# Patient Record
Sex: Female | Born: 1980 | Race: Black or African American | Hispanic: No
Health system: Western US, Academic
[De-identification: ages and names within clinical notes are randomized; demographics above are authoritative.]

## PROBLEM LIST (undated history)

## (undated) DIAGNOSIS — K219 Gastro-esophageal reflux disease without esophagitis: Secondary | ICD-10-CM

## (undated) DIAGNOSIS — R112 Nausea with vomiting, unspecified: Secondary | ICD-10-CM

## (undated) DIAGNOSIS — M1990 Unspecified osteoarthritis, unspecified site: Secondary | ICD-10-CM

## (undated) DIAGNOSIS — Z9889 Other specified postprocedural states: Secondary | ICD-10-CM

## (undated) DIAGNOSIS — R011 Cardiac murmur, unspecified: Secondary | ICD-10-CM

## (undated) DIAGNOSIS — S86819A Strain of other muscle(s) and tendon(s) at lower leg level, unspecified leg, initial encounter: Secondary | ICD-10-CM

## (undated) DIAGNOSIS — T7840XA Allergy, unspecified, initial encounter: Secondary | ICD-10-CM

## (undated) HISTORY — PX: PR UNLISTED PROCEDURE HANDS/FINGERS: 26989

## (undated) HISTORY — DX: Strain of other muscle(s) and tendon(s) at lower leg level, unspecified leg, initial encounter: S86.819A

## (undated) HISTORY — DX: Allergy, unspecified, initial encounter: T78.40XA

## (undated) DEATH — deceased

---

## 1999-03-25 HISTORY — PX: WISDOM TOOTH EXTRACTION: SHX21

## 2013-03-24 HISTORY — PX: GUM SURGERY: SHX658

## 2015-03-25 HISTORY — PX: PATELLAR TENDON REPAIR: SHX737

## 2016-01-07 DIAGNOSIS — S838X9A Sprain of other specified parts of unspecified knee, initial encounter: Secondary | ICD-10-CM | POA: Insufficient documentation

## 2016-01-07 DIAGNOSIS — S86819A Strain of other muscle(s) and tendon(s) at lower leg level, unspecified leg, initial encounter: Secondary | ICD-10-CM | POA: Insufficient documentation

## 2016-01-07 DIAGNOSIS — Q682 Congenital deformity of knee: Secondary | ICD-10-CM | POA: Insufficient documentation

## 2017-03-24 DIAGNOSIS — Z8614 Personal history of Methicillin resistant Staphylococcus aureus infection: Secondary | ICD-10-CM

## 2017-03-24 HISTORY — DX: Personal history of Methicillin resistant Staphylococcus aureus infection: Z86.14

## 2019-02-08 ENCOUNTER — Encounter (HOSPITAL_BASED_OUTPATIENT_CLINIC_OR_DEPARTMENT_OTHER): Payer: No Typology Code available for payment source | Admitting: Surgery of the Hand

## 2019-02-08 ENCOUNTER — Other Ambulatory Visit (HOSPITAL_BASED_OUTPATIENT_CLINIC_OR_DEPARTMENT_OTHER): Payer: Self-pay | Admitting: Physician Assistant

## 2019-02-08 DIAGNOSIS — R2231 Localized swelling, mass and lump, right upper limb: Secondary | ICD-10-CM

## 2019-02-14 ENCOUNTER — Ambulatory Visit (HOSPITAL_BASED_OUTPATIENT_CLINIC_OR_DEPARTMENT_OTHER): Payer: No Typology Code available for payment source | Attending: Physician Assistant | Admitting: Orthopaedic Surgery

## 2019-02-14 ENCOUNTER — Encounter (HOSPITAL_BASED_OUTPATIENT_CLINIC_OR_DEPARTMENT_OTHER): Payer: Self-pay | Admitting: Orthopaedic Surgery

## 2019-02-14 ENCOUNTER — Ambulatory Visit (HOSPITAL_BASED_OUTPATIENT_CLINIC_OR_DEPARTMENT_OTHER): Payer: No Typology Code available for payment source | Admitting: Diagnostic Radiology

## 2019-02-14 VITALS — BP 126/72 | HR 71 | Ht 68.0 in | Wt 230.0 lb

## 2019-02-14 DIAGNOSIS — M6740 Ganglion, unspecified site: Secondary | ICD-10-CM | POA: Insufficient documentation

## 2019-02-14 DIAGNOSIS — R2231 Localized swelling, mass and lump, right upper limb: Secondary | ICD-10-CM

## 2019-02-14 DIAGNOSIS — M25841 Other specified joint disorders, right hand: Secondary | ICD-10-CM

## 2019-02-14 HISTORY — DX: Other specified joint disorders, right hand: M25.841

## 2019-02-14 NOTE — Progress Notes (Signed)
Chief Complaint   Patient presents with   . New Patient     cyst on right RF       Loretta Allen is a 38 year old RHD female who works in [employer] for [employer], she previously worked as a Industrial/product designer for [employer] and currently owns a horse which she rides in show competitions.  She presents with a chief complaint of swelling in the right ring finger that has been ongoing for 6 months.  Patient denies any history of trauma or injury to the hand. The swelling initially began at her MCPJ on the plamar aspect and felt like a small callous. However, it has gradually increased in size and has migrated to the ulnar side of the digit. This is usually not painful, however when she has to grip object like a car steering wheel or horse reins it gets in the way and is tender. She denies numbness or tingling in the digit.    Past Medical History:  Past Medical History:   Diagnosis Date   . Cyst of joint of hand, right 02/14/2019    ring finger   . History of MRSA infection 2019       Medications:  No current outpatient medications on file.     No current facility-administered medications for this visit.        Allergies:   Review of patient's allergies indicates:  No Known Allergies    Past Surgical History:  Past Surgical History:   Procedure Laterality Date   . PATELLA SURGERY      tendon repair       Social History:   The patient states her problem is not work related.   She rides her horse 4-5 days per week and is active in show competitions.    Social History     Substance and Sexual Activity   Alcohol Use Yes   . Frequency: 2-3 times a week     Social History     Tobacco Use   Smoking Status Never Smoker   Smokeless Tobacco Never Used       ROS:   Positive for MSK findings as noted above in HPI. All other systems of the 14 reviewed are negative.     Hand & Upper Extremity Examination    Physical Examination  Gen: Patient is healthy, alert, no distress    Psych: Alert and oriented times  3  Pleasant female. Mood and affect appropriate.    Skin: warm, normal color and sweat patterns.    Vascular: Fingers warm, pink, with brisk capillary refill    Neurologic: Sensation in R hand intact in median, radial, and ulnar nerve distributions. There is no numbness or tingling of the digits with palpation of the RF mass.    Musculoskeletal:  Inspection of the  right upper extremity shows no gross deformity or evidence of atrophy.  2 mm round, mobile, fluid-filled cyst over the ulnar border of the R RF MCPJ. There are no overlying skin changes. The mass is slightly tender to palpation.  Full composite flexion/extension all digits  5/5 strength APB, intrinsics    Studies:  X-rays of the R RF were reviewed and show no acute fractures or dislocations, there are no osteophytes or osteoarthritis present.    Assessment:   Loretta Allen is a 38 year old RHD female who presents with a R RF retinacular cyst, increasing in size for the past several months.  Plan:  1. Patient will undergo excision of R RF cyst at her convenience, she would like to delay surgery until after 02/26/19 as she has a horse competition show that day. She was consented for surgery in clinic today. We discussed the risks, benefits, and alternatives to surgery. Risks include a infection and damage to some of the digital nerves and vessels of the RF. She expressed her understanding and elected to proceed with surgery. She will meet with the surgery scheduler today.  2. Preop COVID test  3. Follow-up after procedure

## 2019-02-14 NOTE — Progress Notes (Signed)
I saw and evaluated the patient and agree with Dr. Delbert Harness note.

## 2019-02-28 ENCOUNTER — Ambulatory Visit (HOSPITAL_BASED_OUTPATIENT_CLINIC_OR_DEPARTMENT_OTHER): Payer: No Typology Code available for payment source | Attending: Internal Medicine

## 2019-02-28 ENCOUNTER — Ambulatory Visit (HOSPITAL_BASED_OUTPATIENT_CLINIC_OR_DEPARTMENT_OTHER): Payer: No Typology Code available for payment source

## 2019-02-28 DIAGNOSIS — Z01812 Encounter for preprocedural laboratory examination: Secondary | ICD-10-CM

## 2019-02-28 DIAGNOSIS — Z20828 Contact with and (suspected) exposure to other viral communicable diseases: Secondary | ICD-10-CM

## 2019-02-28 LAB — COVID-19 CORONAVIRUS QUALITATIVE PCR: COVID-19 Coronavirus Qual PCR Result: NOT DETECTED

## 2019-02-28 NOTE — Progress Notes (Signed)
Patient was seen on 02/28/2019 at the [facility] drive up site where a sample of dual nasal pharyngeal collection was taken. The specimen was sent to the [facility] lab for COVID-19 testing.  Patient will be informed of test results within 48 hours.  Patient received informational instructions on self-care.    The specimen was collected by: J.O.

## 2019-03-02 ENCOUNTER — Other Ambulatory Visit (HOSPITAL_COMMUNITY): Payer: Self-pay | Admitting: Student in an Organized Health Care Education/Training Program

## 2019-03-02 ENCOUNTER — Ambulatory Visit (HOSPITAL_BASED_OUTPATIENT_CLINIC_OR_DEPARTMENT_OTHER): Payer: No Typology Code available for payment source | Admitting: Orthopaedic Surgery

## 2019-03-02 ENCOUNTER — Ambulatory Visit (HOSPITAL_BASED_OUTPATIENT_CLINIC_OR_DEPARTMENT_OTHER)
Admission: RE | Admit: 2019-03-02 | Discharge: 2019-03-02 | Disposition: A | Discharge status: Home / Self Care | Payer: No Typology Code available for payment source | Attending: Orthopaedic Surgery | Admitting: Orthopaedic Surgery

## 2019-03-02 DIAGNOSIS — E669 Obesity, unspecified: Secondary | ICD-10-CM | POA: Insufficient documentation

## 2019-03-02 DIAGNOSIS — M25841 Other specified joint disorders, right hand: Secondary | ICD-10-CM | POA: Insufficient documentation

## 2019-03-02 DIAGNOSIS — R2231 Localized swelling, mass and lump, right upper limb: Secondary | ICD-10-CM

## 2019-03-02 DIAGNOSIS — K219 Gastro-esophageal reflux disease without esophagitis: Secondary | ICD-10-CM | POA: Insufficient documentation

## 2019-03-02 DIAGNOSIS — Z6834 Body mass index (BMI) 34.0-34.9, adult: Secondary | ICD-10-CM | POA: Insufficient documentation

## 2019-03-02 MED ORDER — OXYCODONE HCL 5 MG OR TABS
ORAL_TABLET | ORAL | 0 refills | Status: AC
Start: 2019-03-02 — End: 2019-08-29

## 2019-03-02 MED ORDER — OMEPRAZOLE 20 MG OR CPDR
DELAYED_RELEASE_CAPSULE | ORAL | 0 refills | Status: AC
Start: 2019-03-02 — End: 2020-03-01

## 2019-03-02 MED ORDER — ONDANSETRON HCL 4 MG OR TABS
ORAL_TABLET | ORAL | 0 refills | Status: AC
Start: 2019-03-02 — End: 2020-03-01

## 2019-03-02 MED ORDER — SENNOSIDES 8.6 MG OR TABS
ORAL_TABLET | ORAL | 0 refills | Status: AC
Start: 2019-03-02 — End: 2020-03-01

## 2019-03-02 MED ORDER — IBUPROFEN 600 MG OR TABS
ORAL_TABLET | ORAL | 0 refills | Status: AC
Start: 2019-03-02 — End: 2020-03-01

## 2019-03-02 MED ORDER — ACETAMINOPHEN 500 MG OR TABS
ORAL_TABLET | ORAL | 3 refills | Status: AC
Start: 2019-03-02 — End: 2020-03-01

## 2019-03-14 ENCOUNTER — Encounter (HOSPITAL_BASED_OUTPATIENT_CLINIC_OR_DEPARTMENT_OTHER): Payer: Self-pay | Admitting: Physician Assistant

## 2019-03-14 ENCOUNTER — Ambulatory Visit (HOSPITAL_BASED_OUTPATIENT_CLINIC_OR_DEPARTMENT_OTHER): Payer: No Typology Code available for payment source | Attending: Physician Assistant | Admitting: Physician Assistant

## 2019-03-14 VITALS — BP 120/64 | HR 77 | Resp 16 | Ht 68.0 in | Wt 230.0 lb

## 2019-03-14 DIAGNOSIS — L720 Epidermal cyst: Secondary | ICD-10-CM | POA: Insufficient documentation

## 2019-03-14 NOTE — Progress Notes (Signed)
Clinic Followup Note  DOS: 03/02/2019 by Dr. Danae Chen  Procedure:   Excision of right ring finger retinacular cyst.     HPI: Loretta Allen is a 38 year old female 12 days status post retinacular cyst excision of the right ring finger.    Interval History: She reports doing well following surgery with minimal pain and only some residual soreness at this point.  She has questions about protecting the finger for equestrian activities.    Past medical history, surgical history, medications and allergies were reviewed in EPIC and are unchanged.    Review of Systems: Pertinent positives per interval history; no further neurologic, musculoskeletal or integument complaints.    Physical Exam:   Blood pressure 120/64, pulse 77, resp. rate 16, height 5\' 8"  (1.727 m), weight (!) 230 lb (104.3 kg).  Gen: NAD   Neuro: AOx3   Ext: Right ring finger:  Incision is somewhat gapped today without complete dehiscence.  The base of the incision appears well adhered.  Nylon sutures are removed and Steri-Strips are applied.    Imaging:  None    Assessment and Plan:   Loretta Allen is a 38 year old female with healing retinacular excision of the right ring finger, 12 days ago by Dr. Danae Chen.  She is doing well today clinically.  There are some slight gapping of the incision however no suggestion of full dehiscence or infection.  Sutures are removed and Steri-Strips are applied.  She may progress activities in a gradual fashion as tolerated.  I explained she would likely have slightly thicker scar tissue as a result is counseled on antiscar-measures/massage.  There was no pathology result available to review with her.  She may follow-up in clinic on an as-needed basis. Patient voiced agreement and understanding with the treatment plan.     Loretta Allen, MPH, MCHS, PA-C  [facility]  Department of Orthopedics

## 2019-03-25 HISTORY — PX: FINGER MASS EXCISION: SHX1638

## 2020-12-31 ENCOUNTER — Other Ambulatory Visit: Payer: Self-pay

## 2020-12-31 ENCOUNTER — Encounter: Payer: Self-pay | Admitting: Family Medicine

## 2020-12-31 ENCOUNTER — Ambulatory Visit (INDEPENDENT_AMBULATORY_CARE_PROVIDER_SITE_OTHER): Payer: BC Managed Care – PPO | Admitting: Family Medicine

## 2020-12-31 VITALS — BP 98/64 | HR 80 | Temp 98.1°F | Ht 67.0 in | Wt 249.0 lb

## 2020-12-31 DIAGNOSIS — E282 Polycystic ovarian syndrome: Secondary | ICD-10-CM | POA: Diagnosis not present

## 2020-12-31 DIAGNOSIS — J0100 Acute maxillary sinusitis, unspecified: Secondary | ICD-10-CM | POA: Diagnosis not present

## 2020-12-31 DIAGNOSIS — M222X2 Patellofemoral disorders, left knee: Secondary | ICD-10-CM | POA: Diagnosis not present

## 2020-12-31 DIAGNOSIS — T63481S Toxic effect of venom of other arthropod, accidental (unintentional), sequela: Secondary | ICD-10-CM | POA: Diagnosis not present

## 2020-12-31 MED ORDER — DICLOFENAC SODIUM 2 % EX SOLN
2.0000 | Freq: Two times a day (BID) | CUTANEOUS | 0 refills | Status: DC
Start: 2020-12-31 — End: 2021-01-31

## 2020-12-31 NOTE — Progress Notes (Signed)
Primary Care / Sports Medicine Office Visit  Patient Information:  Patient ID: Sarah Lowe, female DOB: 12-13-1980 Age: 40 y.o. MRN: [id number]   Sarah Lowe is a pleasant 40 y.o. female presenting with the following:  Chief Complaint  Patient presents with   New Patient (Initial Visit)   Establish Care    Moved from [place], [place] about a year ago   Allergic Rhinitis     Intermittent headaches and congestion; has tried taking Claritin and Zyrtec with no relief of symptoms, so not taking anything now; has Epi pen on hand in case she needs it for anaphylactic reaction she had from a bug bite 09/2020    Obesity    Concern for weight gain; history of taking phentermine 37.5 mg daily and is getting current B12, HCG, and lipotropic injections weekly at [facility]; history of rupturing bilateral patellar tendons 2017; left knee pain in office today; swelling associated; no exercise; 7/10 pain    Review of Systems pertinent details above   Patient Active Problem List   Diagnosis Date Noted   Patellofemoral syndrome, left 12/31/2020   Insect sting allergy, current reaction, accidental or unintentional, sequela 12/31/2020   Acute non-recurrent maxillary sinusitis 12/31/2020   PCOS (polycystic ovarian syndrome) 12/31/2020   Past Medical History:  Diagnosis Date   Allergy    Outpatient Encounter Medications as of 12/31/2020  Medication Sig   Diclofenac Sodium 2 % SOLN Place 2 sprays onto the skin 2 (two) times daily.   EPINEPHrine 0.3 mg/0.3 mL IJ SOAJ injection Inject 0.3 mg into the muscle once as needed.   phentermine (ADIPEX-P) 37.5 MG tablet Take 37.5 mg by mouth daily.   No facility-administered encounter medications on file as of 12/31/2020.   Past Surgical History:  Procedure Laterality Date   FINGER MASS EXCISION Right 2021   fourth finger   GUM SURGERY  2015   PATELLAR TENDON REPAIR Bilateral 2017   WISDOM TOOTH EXTRACTION Bilateral 2001     Vitals:   12/31/20 1110  BP: 98/64  Pulse: 80  Temp: 98.1 F (36.7 C)  SpO2: 99%   Vitals:   12/31/20 1110  Weight: 249 lb (112.9 kg)  Height: 5\' 7"  (1.702 m)   Body mass index is 39 kg/m.  No results found.   Independent interpretation of notes and tests performed by another provider:   None  Procedures performed:   None  Pertinent History, Exam, Impression, and Recommendations:   PCOS (polycystic ovarian syndrome) Stated history of the same, referral to OB/GYN was placed.  She has been on metformin in the past this was discontinued citing efficacy issues.  I discussed the nature of PCOS and associated treatments.  She is amenable to following up with OB/GYN, she will return for follow-up in our office for annual physical (no gynecologic), risk stratification labs to be obtained at that time.  Patellofemoral syndrome, left Chronic issue with ongoing exacerbation, she has had what she describes as a tibial tubercle osteotomy for this with subsequent revision.  She remains symptomatic, pain focal to the superior/medial aspect of the patella, nonradiating, she has secondary lateral hip pain.  Examination reveals focal VMO insertional tenderness, otherwise benign.  Plan for plain films, formal physical therapy, Rx samples Pennsaid twice daily until follow-up in 3 weeks.  I did discuss additional treatment strategies in the future inclusive of possible viscosupplementation, orthotics, etc.  Acute non-recurrent maxillary sinusitis Currently on Augmentin through outside clinic, ENT exam reveals  erythematous and swollen turbinates, left greater than right, right maxillary tenderness, tympanic membranes and oropharynx otherwise benign.  Cardiopulmonary examination benign today.  I have advised adjunct Flonase until antibiotics complete, she can then transition to seasonal usage.  Insect sting allergy, current reaction, accidental or unintentional, sequela New onset systemic  response from insect bite, does have Rx for EpiPen from urgent care.  She denies any additional occurrences and does cite total resolution from previous episode.  I have placed a referral to allergist for further evaluation of this issue.   Orders & Medications Meds ordered this encounter  Medications   Diclofenac Sodium 2 % SOLN    Sig: Place 2 sprays onto the skin 2 (two) times daily.    Dispense:  6 g    Refill:  0    Use Programme researcher, broadcasting/film/video for discount. Dx. Osteoarthritis   Orders Placed This Encounter  Procedures   DG Knee Complete 4 Views Left   Ambulatory referral to Allergy   Ambulatory referral to Gynecology   Ambulatory referral to Physical Therapy     Return in about 3 weeks (around 01/21/2021) for annual physical.     Jerrol Banana, MD   Primary Care Sports Medicine [facility]

## 2020-12-31 NOTE — Assessment & Plan Note (Addendum)
Currently on Augmentin through outside clinic, ENT exam reveals erythematous and swollen turbinates, left greater than right, right maxillary tenderness, tympanic membranes and oropharynx otherwise benign.  Cardiopulmonary examination benign today.  I have advised adjunct Flonase until antibiotics complete, she can then transition to seasonal usage.

## 2020-12-31 NOTE — Patient Instructions (Addendum)
-   Obtain x-rays - Start Flonase, using each nostril daily until antibiotic regimen completed - Can then transition to seasonal usage - Start topical anti-inflammatory Pennsaid (diclofenac 2%), apply scant amount twice daily to symptomatic area of the knee until follow-up - Referral coordinator will contact you in regards to physical therapy, allergist, OB/GYN - Return for follow-up in 3 weeks for annual physical - Contact us for questions between now and then

## 2020-12-31 NOTE — Assessment & Plan Note (Signed)
New onset systemic response from insect bite, does have Rx for EpiPen from urgent care.  She denies any additional occurrences and does cite total resolution from previous episode.  I have placed a referral to allergist for further evaluation of this issue.

## 2020-12-31 NOTE — Assessment & Plan Note (Signed)
Chronic issue with ongoing exacerbation, she has had what she describes as a tibial tubercle osteotomy for this with subsequent revision.  She remains symptomatic, pain focal to the superior/medial aspect of the patella, nonradiating, she has secondary lateral hip pain.  Examination reveals focal VMO insertional tenderness, otherwise benign.  Plan for plain films, formal physical therapy, Rx samples Pennsaid twice daily until follow-up in 3 weeks.  I did discuss additional treatment strategies in the future inclusive of possible viscosupplementation, orthotics, etc.

## 2020-12-31 NOTE — Assessment & Plan Note (Signed)
Stated history of the same, referral to OB/GYN was placed.  She has been on metformin in the past this was discontinued citing efficacy issues.  I discussed the nature of PCOS and associated treatments.  She is amenable to following up with OB/GYN, she will return for follow-up in our office for annual physical (no gynecologic), risk stratification labs to be obtained at that time.

## 2021-01-15 ENCOUNTER — Telehealth: Payer: Self-pay

## 2021-01-15 NOTE — Telephone Encounter (Signed)
Copied from CRM [id number]. Topic: General - Other >> Jan 15, 2021  2:15 PM Gaetana Michaelis A wrote: Reason for CRM: The patient has called to request that referral [phone] be submitted to    [facility] in [place]  Fax [phone]   Please contact further if needed >> Jan 15, 2021  3:11 PM Randol Kern wrote: Pt called back to request that her referral be submitted to the location/fax listed below and not the one in [place] because that is too far of a drive for her she says.

## 2021-01-15 NOTE — Telephone Encounter (Signed)
Copied from CRM [id number]. Topic: General - Other >> Jan 15, 2021  2:15 PM Gaetana Michaelis A wrote: Reason for CRM: The patient has called to request that referral [phone] be submitted to    [facility] in [place]  Fax [phone]   Please contact further if needed

## 2021-01-16 ENCOUNTER — Encounter: Payer: Self-pay | Admitting: Obstetrics and Gynecology

## 2021-01-16 ENCOUNTER — Ambulatory Visit: Payer: BC Managed Care – PPO | Admitting: Obstetrics and Gynecology

## 2021-01-16 ENCOUNTER — Ambulatory Visit
Admission: RE | Admit: 2021-01-16 | Discharge: 2021-01-16 | Disposition: A | Discharge status: Home / Self Care | Payer: BC Managed Care – PPO | Attending: Family Medicine | Admitting: Family Medicine

## 2021-01-16 ENCOUNTER — Other Ambulatory Visit (HOSPITAL_COMMUNITY)
Admission: RE | Admit: 2021-01-16 | Discharge: 2021-01-16 | Disposition: A | Discharge status: Home / Self Care | Payer: BC Managed Care – PPO | Source: Ambulatory Visit | Attending: Obstetrics and Gynecology | Admitting: Obstetrics and Gynecology

## 2021-01-16 ENCOUNTER — Other Ambulatory Visit: Payer: Self-pay

## 2021-01-16 ENCOUNTER — Ambulatory Visit
Admission: RE | Admit: 2021-01-16 | Discharge: 2021-01-16 | Disposition: A | Discharge status: Home / Self Care | Payer: BC Managed Care – PPO | Source: Ambulatory Visit | Attending: Family Medicine | Admitting: Family Medicine

## 2021-01-16 VITALS — BP 130/81 | Ht 68.0 in | Wt 248.0 lb

## 2021-01-16 DIAGNOSIS — M222X2 Patellofemoral disorders, left knee: Secondary | ICD-10-CM

## 2021-01-16 DIAGNOSIS — E282 Polycystic ovarian syndrome: Secondary | ICD-10-CM

## 2021-01-16 DIAGNOSIS — Z3169 Encounter for other general counseling and advice on procreation: Secondary | ICD-10-CM | POA: Diagnosis not present

## 2021-01-16 DIAGNOSIS — Z124 Encounter for screening for malignant neoplasm of cervix: Secondary | ICD-10-CM

## 2021-01-18 LAB — CYTOLOGY - PAP
Adequacy: ABSENT
Comment: NEGATIVE
Diagnosis: NEGATIVE
High risk HPV: NEGATIVE

## 2021-01-18 NOTE — Telephone Encounter (Signed)
Message sent to Retta Diones to update referral location as requested.

## 2021-01-21 ENCOUNTER — Encounter: Payer: BC Managed Care – PPO | Admitting: Family Medicine

## 2021-01-22 LAB — ANTI MULLERIAN HORMONE: ANTI-MULLERIAN HORMONE (AMH): 3.51 ng/mL

## 2021-01-22 NOTE — Progress Notes (Signed)
Obstetrics & Gynecology Office Visit   Chief Complaint:  Chief Complaint  Patient presents with   PCOS    Referral  - RM 3    History of Present Illness: Patient is a 40 y.o. 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 She carries a prior diagnosis of PCOS although cycles have been relatively regular recently.  She had one prior conception with EAB  Ovulatory Evaluation LMP: Patient's last menstrual period was 12/19/2020. Interval 30days Duration of flow: 5 days Heavy Menses: no Clots: no Intermenstrual Bleeding: no Dysmenorrhea: no  Molimina yes Ovulation Predictor Kits Positive  unknown  PCOS  Did do body building for several year and lost a significant amount of weight an body fat at which time she actually experience amenorrhea. Hirsutism: no Acne: no  Hyperprolactinemia Galactorrhea: no Headaches: no Vision Changes:  no  Thyroid Temperature Intolerance: no Constipation or Diarrhea: no Hair Thinning:  no Palpitation:  no  Prior treatments Meds: none Other Therapies: Not applicable  Premature Ovarian Failure Family history of autism, mental retardation, or fragile X: no Family history of premature ovarian failure or early menopause: no Prior radiation or chemotherapy exposoure:  no  Tubal Factor Previous abdominal or pelvic surgery: no Pelvic Pain:  no Endometriosis: no Laparoscopy: no Prior HSG: no  Review of Systems: Review of Systems  Constitutional: Negative.   Gastrointestinal: Negative.   Genitourinary: Negative.   Neurological:  Negative for headaches.    Past Medical History:  Past Medical History:  Diagnosis Date   Allergy     Past Surgical History:  Past Surgical History:  Procedure Laterality Date   FINGER MASS EXCISION Right 2021   fourth finger   GUM SURGERY  2015   PATELLAR TENDON REPAIR Bilateral 2017   WISDOM TOOTH EXTRACTION Bilateral 2001    Gynecologic History: Patient's last menstrual period was  12/19/2020.  Obstetric History: G1P0010  Family History:  Family History  Problem Relation Age of Onset   Cancer Mother    Anemia Mother    Other Father        Agent Orange    Social History:  Social History   Socioeconomic History   Marital status: Married    Spouse name: Not on file   Number of children: Not on file   Years of education: Not on file   Highest education level: Not on file  Occupational History   Not on file  Tobacco Use   Smoking status: Never   Smokeless tobacco: Never  Vaping Use   Vaping Use: Never used  Substance and Sexual Activity   Alcohol use: Yes    Alcohol/week: 3.0 standard drinks    Types: 3 Standard drinks or equivalent per week   Drug use: Never   Sexual activity: Yes    Partners: Male    Birth control/protection: None  Other Topics Concern   Not on file  Social History Narrative   Not on file   Social Determinants of Health   Financial Resource Strain: Not on file  Food Insecurity: Not on file  Transportation Needs: Not on file  Physical Activity: Not on file  Stress: Not on file  Social Connections: Not on file  Intimate Partner Violence: Not on file    Allergies:  No Known Allergies  Medications: Prior to Admission medications   Medication Sig Start Date End Date Taking? Authorizing Provider  Diclofenac Sodium 2 % SOLN Place 2 sprays onto the skin 2 (two) times daily. 12/31/20  Jerrol Banana, MD  EPINEPHrine 0.3 mg/0.3 mL IJ SOAJ injection Inject 0.3 mg into the muscle once as needed. 09/30/20   [provider]  phentermine (ADIPEX-P) 37.5 MG tablet Take 37.5 mg by mouth daily. Patient not taking: Reported on 01/16/2021 10/13/20   [provider]    Physical Exam Vitals:  Vitals:   01/16/21 0941  BP: 130/81   Patient's last menstrual period was 12/19/2020.  General: NAD HEENT: normocephalic, anicteric Pulmonary: No increased work of breathing Abdomen: soft, non-tender, non-distended.   Umbilicus without lesions.  No hepatomegaly, splenomegaly or masses palpable. No evidence of hernia  Genitourinary:  External: Normal external female genitalia.  Normal urethral meatus, normal  Bartholin's and Skene's glands.    Vagina: Normal vaginal mucosa, no evidence of prolapse.    Cervix: Grossly normal in appearance, no bleeding  Uterus: Non-enlarged, mobile, normal contour.  No CMT  Adnexa: ovaries non-enlarged, no adnexal masses  Rectal: deferred  Lymphatic: no evidence of inguinal lymphadenopathy Extremities: no edema, erythema, or tenderness Neurologic: Grossly intact Psychiatric: mood appropriate, affect full  Female chaperone present for pelvic  portions of the physical exam  Assessment: 40 y.o. G1P001 history PCOS, preconception counseling  Plan: Problem List Items Addressed This Visit       Endocrine   PCOS (polycystic ovarian syndrome)   Relevant Orders   Anti mullerian hormone   Other Visit Diagnoses     Screening for malignant neoplasm of cervix    -  Primary   Relevant Orders   Cytology - PAP (Completed)   Encounter for preconception consultation       Relevant Orders   Anti mullerian hormone      1) History of PCOS - menstrual cycles have been more regular potentially interested in future fertility.  We discussed that PCOS may resolve over time particularly with weight loss. The patient does report she had a time frame where she worked out very diligently and got into body building, actually experienced amenorrhea likely to low body fat percentage.   Based on available IVF data there appears to be a large drop in overall fertility after age 63, and there is also an associated increase rate in miscarriage likely owing to the increased risk of trisomy with age.  The percentage of IVF cycle starts that resulted in live births was 41.5% in women younger than 35 years, 31.9% in women aged 35-37 years, 22.1% in women aged 38-40 years, 12.4% in women aged 41-42  years, 5% in women aged 43-44 years, and 1 % for women older than 44 years.  The rates of fetal loss and aneuploidy also increase based on maternal age.  Although 9.9% of women younger than 33 years who conceive during IVF with a fresh embryo transfer have a pregnancy loss after 7 weeks of gestation with fetal heart activity observed, the rates of miscarriage progressively increase from 11.4% for women aged 41-34 years to 13.7% for women aged 35-37 years, 19.8% for women aged 38-40 years, 29.9% for women aged 20-42 years, and 36.6% for women older than 40 years ("Female Age- Related Fertility Decline" ACOG Committee Opinion 589, March 2014 Reaffirmed 2018)  2) Health care maintenance - pap brought up to date.  3) A total of 20 minutes were spent in face-to-face contact with the patient during this encounter with over half of that time devoted to counseling and coordination of care.   Vena Austria, MD, [facility], [facility] 01/22/2021, 10:55 AM

## 2021-01-31 ENCOUNTER — Ambulatory Visit (INDEPENDENT_AMBULATORY_CARE_PROVIDER_SITE_OTHER): Payer: BC Managed Care – PPO | Admitting: Family Medicine

## 2021-01-31 ENCOUNTER — Encounter: Payer: Self-pay | Admitting: Family Medicine

## 2021-01-31 ENCOUNTER — Other Ambulatory Visit: Payer: Self-pay

## 2021-01-31 ENCOUNTER — Ambulatory Visit
Admission: RE | Admit: 2021-01-31 | Discharge: 2021-01-31 | Disposition: A | Discharge status: Home / Self Care | Payer: BC Managed Care – PPO | Attending: Family Medicine | Admitting: Family Medicine

## 2021-01-31 ENCOUNTER — Ambulatory Visit
Admission: RE | Admit: 2021-01-31 | Discharge: 2021-01-31 | Disposition: A | Discharge status: Home / Self Care | Payer: BC Managed Care – PPO | Source: Ambulatory Visit | Attending: Family Medicine | Admitting: Family Medicine

## 2021-01-31 VITALS — BP 106/64 | HR 70 | Temp 98.0°F | Ht 68.0 in | Wt 250.0 lb

## 2021-01-31 DIAGNOSIS — R29818 Other symptoms and signs involving the nervous system: Secondary | ICD-10-CM

## 2021-01-31 DIAGNOSIS — E559 Vitamin D deficiency, unspecified: Secondary | ICD-10-CM

## 2021-01-31 DIAGNOSIS — Z114 Encounter for screening for human immunodeficiency virus [HIV]: Secondary | ICD-10-CM

## 2021-01-31 DIAGNOSIS — R112 Nausea with vomiting, unspecified: Secondary | ICD-10-CM | POA: Insufficient documentation

## 2021-01-31 DIAGNOSIS — Z1159 Encounter for screening for other viral diseases: Secondary | ICD-10-CM | POA: Diagnosis not present

## 2021-01-31 DIAGNOSIS — Q682 Congenital deformity of knee: Secondary | ICD-10-CM

## 2021-01-31 DIAGNOSIS — M545 Low back pain, unspecified: Secondary | ICD-10-CM | POA: Diagnosis not present

## 2021-01-31 DIAGNOSIS — R292 Abnormal reflex: Secondary | ICD-10-CM | POA: Diagnosis not present

## 2021-01-31 DIAGNOSIS — R1112 Projectile vomiting: Secondary | ICD-10-CM

## 2021-01-31 DIAGNOSIS — Z1211 Encounter for screening for malignant neoplasm of colon: Secondary | ICD-10-CM

## 2021-01-31 DIAGNOSIS — Z13220 Encounter for screening for lipoid disorders: Secondary | ICD-10-CM

## 2021-01-31 DIAGNOSIS — Z0000 Encounter for general adult medical examination without abnormal findings: Secondary | ICD-10-CM | POA: Diagnosis not present

## 2021-01-31 DIAGNOSIS — M222X2 Patellofemoral disorders, left knee: Secondary | ICD-10-CM

## 2021-01-31 DIAGNOSIS — E249 Cushing's syndrome, unspecified: Secondary | ICD-10-CM

## 2021-01-31 MED ORDER — PANTOPRAZOLE SODIUM 40 MG PO TBEC: 40.0000 mg | DELAYED_RELEASE_TABLET | Freq: Every day | ORAL | 1 refills | Status: DC

## 2021-01-31 MED ORDER — DICLOFENAC SODIUM 2 % EX SOLN: 2.0000 | Freq: Two times a day (BID) | CUTANEOUS | 1 refills | Status: DC | PRN

## 2021-01-31 MED ORDER — PANTOPRAZOLE SODIUM 40 MG PO TBEC
40.0000 mg | DELAYED_RELEASE_TABLET | Freq: Every day | ORAL | 1 refills | Status: DC
Start: 2021-01-31 — End: 2021-03-28

## 2021-01-31 NOTE — Assessment & Plan Note (Signed)
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  She has been obtaining dry needling through chiropractic group, does have upcoming PT appointment scheduled.  I have advised continued topical NSAID usage, start of formal physical therapy with focus on quadriceps and quadriceps tendon flexibility and conditioning, stabilization of the patella.  We will follow-up on this issue in 2 months time.

## 2021-01-31 NOTE — Progress Notes (Signed)
Annual Physical Exam Visit  Patient Information:  Patient ID: Sarah Lowe, female DOB: December 11, 1980 Age: 40 y.o. MRN: [id number]   Subjective:   CC: Annual Physical Exam  HPI:  Sarah Lowe is here for their annual physical.  I reviewed the past medical history, family history, social history, surgical history, and allergies today and changes were made as necessary.  Please see the problem list section below for additional details.  Past Medical History: Past Medical History:  Diagnosis Date   Allergy    Past Surgical History: Past Surgical History:  Procedure Laterality Date   FINGER MASS EXCISION Right 2021   fourth finger   GUM SURGERY  2015   PATELLAR TENDON REPAIR Bilateral 2017   WISDOM TOOTH EXTRACTION Bilateral 2001   Family History: Family History  Problem Relation Age of Onset   Cancer Mother    Anemia Mother    Other Father        Agent Orange   Colon cancer Maternal Grandmother    Anuerysm Maternal Grandfather    Stroke Paternal Grandmother    Anuerysm Paternal Grandfather    Allergies: No Known Allergies Health Maintenance: Health Maintenance  Topic Date Due   Hepatitis C Screening  Never done   COVID-19 Vaccine (4 - Booster for Pfizer series) 05/25/2020   INFLUENZA VACCINE  06/21/2021 (Originally 10/22/2020)   TETANUS/TDAP  01/16/2022 (Originally 03/17/1999)   PAP SMEAR-Modifier  01/17/2024   HIV Screening  Completed   Pneumococcal Vaccine 48-35 Years old  Aged Out   HPV VACCINES  Aged Out    HM Colonoscopy     This patient has no relevant Health Maintenance data.      Medications: Current Outpatient Medications on File Prior to Visit  Medication Sig Dispense Refill   amoxicillin (AMOXIL) 500 MG capsule Take 500 mg by mouth 3 (three) times daily.     EPINEPHrine 0.3 mg/0.3 mL IJ SOAJ injection Inject 0.3 mg into the muscle once as needed.     phentermine (ADIPEX-P) 37.5 MG tablet Take 37.5 mg by mouth daily. (Patient not  taking: No sig reported)     No current facility-administered medications on file prior to visit.    Review of Systems: No headache, visual changes, nausea, vomiting, diarrhea, constipation, dizziness, abdominal pain, skin rash, fevers, chills, night sweats, swollen lymph nodes, weight loss, chest pain, body aches, joint swelling, muscle aches, shortness of breath, mood changes, visual or auditory hallucinations reported.  Objective:   Vitals:   01/31/21 1009  BP: 106/64  Pulse: 70  Temp: 98 F (36.7 C)  SpO2: 99%   Vitals:   01/31/21 1009  Weight: 250 lb (113.4 kg)  Height: 5\' 8"  (1.727 m)   Body mass index is 38.01 kg/m.  General: Well Developed, well nourished, and in no acute distress.  Neuro: Alert and oriented x3, extra-ocular muscles intact, sensation grossly intact. Cranial nerves II through XII are grossly intact, motor, sensory, and coordinative functions are intact.  Unable to elicit patellar and Achilles reflexes bilaterally HEENT: Normocephalic, atraumatic, pupils equal round reactive to light, neck supple, no masses, no lymphadenopathy, thyroid nonpalpable. Oropharynx, nasopharynx, external ear canals are unremarkable. Skin: Warm and dry, no rashes noted.  Cardiac: Regular rate and rhythm, no murmurs rubs or gallops. No peripheral edema. Pulses symmetric. Respiratory: Clear to auscultation bilaterally. Not using accessory muscles, speaking in full sentences.  Abdominal: Soft, nontender, nondistended, positive bowel sounds, no masses, no organomegaly. Musculoskeletal: Shoulder, elbow, wrist, hip,  knee, ankle stable, and with full range of motion.  Female chaperone initials: BN present throughout the physical examination.  Impression and Recommendations:   The patient was counselled, risk factors were discussed, and anticipatory guidance given.  Patellofemoral syndrome, left Persistent symptomatology in the setting of noted patella alta, subtle improvement  following oral diclofenac 2% (Pennsaid) usage.  She has been obtaining dry needling through chiropractic group, does have upcoming PT appointment scheduled.  I have advised continued topical NSAID usage, start of formal physical therapy with focus on quadriceps and quadriceps tendon flexibility and conditioning, stabilization of the patella.  We will follow-up on this issue in 2 months time.  Patella alta See additional assessment(s) for plan details.  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  No treatments to date beyond monitoring.  This occurs shortly after meal ingestion.  Abdomen is soft, nontender, nondistended, normoactive bowel sounds appreciated and no hepatosplenomegaly noted.  I have advised trial of Protonix 40 mg daily on an empty stomach, referral to GI for further evaluation management and to assess response from PPI initiation.  Annual physical exam Annual examination completed, risk stratification labs ordered, anticipatory guidance provided.  We will follow labs once resulted.  She will touch base with OB/GYN for breast cancer screening.  Absent reflex of lower extremity Incidentally noted on physical examination today in the setting of stated longstanding horseback riding, no active lumbosacral pain reported.  She is otherwise intact neurovascularly.  Labs will be obtained as well as plain films of the lumbar spine.  We will follow results.  Orders & Medications Medications:  Meds ordered this encounter  Medications   Diclofenac Sodium 2 % SOLN    Sig: Place 2 sprays onto the skin 2 (two) times daily as needed (pain).    Dispense:  112 g    Refill:  1    Use Programme researcher, broadcasting/film/video for discount. Dx. Osteoarthritis   DISCONTD: pantoprazole (PROTONIX) 40 MG tablet    Sig: Take 1 tablet (40 mg total) by mouth daily.    Dispense:  30 tablet     Refill:  1   pantoprazole (PROTONIX) 40 MG tablet    Sig: Take 1 tablet (40 mg total) by mouth daily.    Dispense:  30 tablet    Refill:  1   Orders Placed This Encounter  Procedures   DG Lumbar Spine Complete   TSH Rfx on Abnormal to Free T4   Lipid panel   CBC with Differential/Platelet   Comprehensive metabolic panel   VITAMIN D 25 Hydroxy (Vit-D Deficiency, Fractures)   HIV antibody (with reflex)   Hepatitis C Antibody   Ambulatory referral to Endocrinology   Ambulatory referral to Gastroenterology     Return in about 2 months (around 04/02/2021) for follow-up knee and GI.    Jerrol Banana, MD   Primary Care Sports Medicine [facility] [place]

## 2021-01-31 NOTE — Assessment & Plan Note (Signed)
See additional assessment(s) for plan details. 

## 2021-01-31 NOTE — Assessment & Plan Note (Signed)
Annual examination completed, risk stratification labs ordered, anticipatory guidance provided.  We will follow labs once resulted.  She will touch base with OB/GYN for breast cancer screening.

## 2021-01-31 NOTE — Assessment & Plan Note (Signed)
Longstanding concern, patient notes sporadic "projectile vomiting "of partially digested food contents after meals, no matter the meal content.  No treatments to date beyond monitoring.  This occurs shortly after meal ingestion.  Abdomen is soft, nontender, nondistended, normoactive bowel sounds appreciated and no hepatosplenomegaly noted.  I have advised trial of Protonix 40 mg daily on an empty stomach, referral to GI for further evaluation management and to assess response from PPI initiation.

## 2021-01-31 NOTE — Patient Instructions (Addendum)
-   Obtain fasting labs with orders provided (can have water or black coffee but otherwise no food or drink x 8 hours before labs) - Obtain low back x-rays - Review information provided - Follow-up with GI and Endocrinology (referral coordinator will contact you) - Start PPI medication daily on empty stomach - Use Pennsaid twice a day to the knee - Start physical therapy - Attend eye doctor annually, dentist every 6 months, work towards or maintain 30 minutes of moderate intensity physical activity at least 5 days per week, and consume a balanced diet - Return in 2 months - Contact us for any questions between now and then

## 2021-01-31 NOTE — Assessment & Plan Note (Signed)
Incidentally noted on physical examination today in the setting of stated longstanding horseback riding, no active lumbosacral pain reported.  She is otherwise intact neurovascularly.  Labs will be obtained as well as plain films of the lumbar spine.  We will follow results.

## 2021-02-01 ENCOUNTER — Other Ambulatory Visit: Payer: Self-pay | Admitting: Family Medicine

## 2021-02-01 DIAGNOSIS — E559 Vitamin D deficiency, unspecified: Secondary | ICD-10-CM

## 2021-02-01 LAB — COMPREHENSIVE METABOLIC PANEL
ALT: 17 IU/L (ref 0–32)
AST: 21 IU/L (ref 0–40)
Albumin/Globulin Ratio: 1.3 (ref 1.2–2.2)
Albumin: 4 g/dL (ref 3.8–4.8)
Alkaline Phosphatase: 77 IU/L (ref 44–121)
BUN/Creatinine Ratio: 12 (ref 9–23)
BUN: 11 mg/dL (ref 6–24)
Bilirubin Total: <0.2 mg/dL (ref 0.0–1.2)
CO2: 24 mmol/L (ref 20–29)
Calcium: 8.8 mg/dL (ref 8.7–10.2)
Chloride: 100 mmol/L (ref 96–106)
Creatinine, Ser: 0.92 mg/dL (ref 0.57–1.00)
Globulin, Total: 3 g/dL (ref 1.5–4.5)
Glucose: 83 mg/dL (ref 70–99)
Potassium: 4.4 mmol/L (ref 3.5–5.2)
Sodium: 137 mmol/L (ref 134–144)
Total Protein: 7 g/dL (ref 6.0–8.5)
eGFR: 81 mL/min/{1.73_m2} (ref >59)

## 2021-02-01 LAB — CBC WITH DIFFERENTIAL/PLATELET
Basophils Absolute: 0 10*3/uL (ref 0.0–0.2)
Basos: 1 %
EOS (ABSOLUTE): 0 10*3/uL (ref 0.0–0.4)
Eos: 1 %
Hematocrit: 37.5 % (ref 34.0–46.6)
Hemoglobin: 12.5 g/dL (ref 11.1–15.9)
Immature Grans (Abs): 0 10*3/uL (ref 0.0–0.1)
Immature Granulocytes: 0 %
Lymphocytes Absolute: 1.3 10*3/uL (ref 0.7–3.1)
Lymphs: 26 %
MCH: 28.7 pg (ref 26.6–33.0)
MCHC: 33.3 g/dL (ref 31.5–35.7)
MCV: 86 fL (ref 79–97)
Monocytes Absolute: 0.4 10*3/uL (ref 0.1–0.9)
Monocytes: 9 %
Neutrophils Absolute: 3.1 10*3/uL (ref 1.4–7.0)
Neutrophils: 63 %
Platelets: 245 10*3/uL (ref 150–450)
RBC: 4.35 x10E6/uL (ref 3.77–5.28)
RDW: 12.1 % (ref 11.7–15.4)
WBC: 4.9 10*3/uL (ref 3.4–10.8)

## 2021-02-01 LAB — VITAMIN D 25 HYDROXY (VIT D DEFICIENCY, FRACTURES): Vit D, 25-Hydroxy: 27.8 ng/mL — ABNORMAL LOW (ref 30.0–100.0)

## 2021-02-01 LAB — LIPID PANEL
Chol/HDL Ratio: 4.4 ratio (ref 0.0–4.4)
Cholesterol, Total: 218 mg/dL — ABNORMAL HIGH (ref 100–199)
HDL: 50 mg/dL (ref >39)
LDL Chol Calc (NIH): 156 mg/dL — ABNORMAL HIGH (ref 0–99)
Triglycerides: 70 mg/dL (ref 0–149)
VLDL Cholesterol Cal: 12 mg/dL (ref 5–40)

## 2021-02-01 LAB — TSH RFX ON ABNORMAL TO FREE T4: TSH: 1.22 u[IU]/mL (ref 0.450–4.500)

## 2021-02-01 LAB — HIV ANTIBODY (ROUTINE TESTING W REFLEX): HIV Screen 4th Generation wRfx: NONREACTIVE

## 2021-02-01 LAB — HEPATITIS C ANTIBODY: Hep C Virus Ab: <0.1 s/co ratio (ref 0.0–0.9)

## 2021-02-01 MED ORDER — VITAMIN D (ERGOCALCIFEROL) 1.25 MG (50000 UNIT) PO CAPS: 50000.0000 [IU] | ORAL_CAPSULE | ORAL | 0 refills | Status: DC

## 2021-02-04 NOTE — Progress Notes (Signed)
Please schedule 2 month follow-up (low back, knees)

## 2021-02-05 DIAGNOSIS — M222X2 Patellofemoral disorders, left knee: Secondary | ICD-10-CM | POA: Diagnosis not present

## 2021-02-05 DIAGNOSIS — M25559 Pain in unspecified hip: Secondary | ICD-10-CM | POA: Diagnosis not present

## 2021-02-11 DIAGNOSIS — M25559 Pain in unspecified hip: Secondary | ICD-10-CM | POA: Diagnosis not present

## 2021-02-11 DIAGNOSIS — M222X2 Patellofemoral disorders, left knee: Secondary | ICD-10-CM | POA: Diagnosis not present

## 2021-02-13 DIAGNOSIS — M222X2 Patellofemoral disorders, left knee: Secondary | ICD-10-CM | POA: Diagnosis not present

## 2021-02-13 DIAGNOSIS — M25559 Pain in unspecified hip: Secondary | ICD-10-CM | POA: Diagnosis not present

## 2021-02-18 DIAGNOSIS — M222X2 Patellofemoral disorders, left knee: Secondary | ICD-10-CM | POA: Diagnosis not present

## 2021-02-18 DIAGNOSIS — M25559 Pain in unspecified hip: Secondary | ICD-10-CM | POA: Diagnosis not present

## 2021-02-20 DIAGNOSIS — M25559 Pain in unspecified hip: Secondary | ICD-10-CM | POA: Diagnosis not present

## 2021-02-20 DIAGNOSIS — M222X2 Patellofemoral disorders, left knee: Secondary | ICD-10-CM | POA: Diagnosis not present

## 2021-02-27 DIAGNOSIS — M25559 Pain in unspecified hip: Secondary | ICD-10-CM | POA: Diagnosis not present

## 2021-02-27 DIAGNOSIS — M222X2 Patellofemoral disorders, left knee: Secondary | ICD-10-CM | POA: Diagnosis not present

## 2021-03-11 DIAGNOSIS — M25559 Pain in unspecified hip: Secondary | ICD-10-CM | POA: Diagnosis not present

## 2021-03-11 DIAGNOSIS — M222X2 Patellofemoral disorders, left knee: Secondary | ICD-10-CM | POA: Diagnosis not present

## 2021-03-13 DIAGNOSIS — M222X2 Patellofemoral disorders, left knee: Secondary | ICD-10-CM | POA: Diagnosis not present

## 2021-03-13 DIAGNOSIS — M25559 Pain in unspecified hip: Secondary | ICD-10-CM | POA: Diagnosis not present

## 2021-03-21 DIAGNOSIS — M222X2 Patellofemoral disorders, left knee: Secondary | ICD-10-CM | POA: Diagnosis not present

## 2021-03-21 DIAGNOSIS — M25559 Pain in unspecified hip: Secondary | ICD-10-CM | POA: Diagnosis not present

## 2021-03-26 DIAGNOSIS — M25559 Pain in unspecified hip: Secondary | ICD-10-CM | POA: Diagnosis not present

## 2021-03-26 DIAGNOSIS — M222X2 Patellofemoral disorders, left knee: Secondary | ICD-10-CM | POA: Diagnosis not present

## 2021-03-28 ENCOUNTER — Inpatient Hospital Stay (INDEPENDENT_AMBULATORY_CARE_PROVIDER_SITE_OTHER): Payer: BC Managed Care – PPO | Admitting: Radiology

## 2021-03-28 ENCOUNTER — Other Ambulatory Visit: Payer: Self-pay

## 2021-03-28 ENCOUNTER — Encounter: Payer: Self-pay | Admitting: Family Medicine

## 2021-03-28 ENCOUNTER — Ambulatory Visit: Payer: BC Managed Care – PPO | Admitting: Family Medicine

## 2021-03-28 VITALS — BP 114/78 | HR 76 | Ht 68.0 in | Wt 253.0 lb

## 2021-03-28 DIAGNOSIS — R1112 Projectile vomiting: Secondary | ICD-10-CM

## 2021-03-28 DIAGNOSIS — M222X2 Patellofemoral disorders, left knee: Secondary | ICD-10-CM

## 2021-03-28 DIAGNOSIS — K121 Other forms of stomatitis: Secondary | ICD-10-CM

## 2021-03-28 DIAGNOSIS — J309 Allergic rhinitis, unspecified: Secondary | ICD-10-CM | POA: Diagnosis not present

## 2021-03-28 MED ORDER — TRIAMCINOLONE ACETONIDE 40 MG/ML IJ SUSP
40.0000 mg | Freq: Once | INTRAMUSCULAR | Status: AC
  Administered 2021-03-28: 40 mg

## 2021-03-28 MED ORDER — PANTOPRAZOLE SODIUM 40 MG PO TBEC: 40.0000 mg | DELAYED_RELEASE_TABLET | Freq: Every day | ORAL | 1 refills | Status: DC

## 2021-03-28 MED ORDER — QNASL 80 MCG/ACT NA AERS: 2.0000 | INHALATION_SPRAY | Freq: Every day | NASAL | 1 refills | Status: DC

## 2021-03-28 NOTE — Assessment & Plan Note (Signed)
Comorbid concern for allergic rhinitis in setting of recurrent maxillary sinusitis.  She has had to rise of antibiotics and a round of steroids over interval visits, has noted minor improvement though still has significant right frontal pain, severe at times, nasal congestion, oral ulceration, mild ear pain bilaterally.  Denies any fevers or chills currently.  She has upcoming visit with ENT scheduled for 04/2021.  Physical examination reveals scattered superficial oral ulcers in various stages of healing, appear to be aphthous ulcers, focal small 1x39mm nodule at the left tonsillar fossa, no exudate noted, mild cobblestone and injected appearance of the oropharynx.  Cardiopulmonary findings are benign.  She did note benefit, though not resolution, from previously recommended Flonase.  I have advised escalating to Qnasl scheduled until her follow-up with ENT, additionally antihistamine of her choosing daily.  She is to maintain follow-up with ENT, we will check on this issue in 2 weeks.  If symptoms fail to improve or worsen at the 2-week mark or beyond, she was advised to contact her office for additional therapy options.

## 2021-03-28 NOTE — Assessment & Plan Note (Signed)
Has noted oral ulcerations, scattered about the lower lip, back of throat, denies history of cold sores.  Examination reveals scattered aphthous appearing ulcers of the lower buccal region in varying stages of healing.  Etiology can include viral/bacterial/fungal.  No clear signs of candidiasis.  Also be sequelae of continued GERD.  She does have upcoming visit with ENT, over the interim advised monitoring this issue and if symptoms fail to improve or worsen at the 2-week mark or beyond, can consider antiviral versus antibiotic regimen.

## 2021-03-28 NOTE — Patient Instructions (Addendum)
You have just been given a cortisone injection to reduce pain and inflammation. After the injection you may notice immediate relief of pain as a result of the Lidocaine. It is important to rest the area of the injection for 24 to 48 hours after the injection. There is a possibility of some temporary increased discomfort and swelling for up to 72 hours until the cortisone begins to work. If you do have pain, simply rest the joint and use ice. If you can tolerate over the counter medications, you can try Tylenol, Aleve, or Advil for added relief per package instructions. - As above, relative rest x2 days and rest return to full activity - Monitor for any symptom change (buckling) - Use Qnasl, 2 sprays in each nostril daily until follow-up with ENT - Start antihistamine (Allegra, etc. daily until follow-up with ENT - If oral ulcers persist at the 2-week mark or beyond or if symptoms worsen, contact her office for next steps - Maintain follow-up with GI, contact number below for scheduling - Return for follow-up in 2 months  [facility] [place]: [phone]

## 2021-03-28 NOTE — Assessment & Plan Note (Addendum)
Chronic issue that is stable/improving following prescription of pantoprazole.  Physical examination reveals benign abdominal findings with normoactive bowel sounds, no hepatosplenomegaly, nontender throughout without rebound.  She has yet to see gastroenterology, I have encouraged her to continue pantoprazole given her interval improvement and to maintain visit with GI for further evaluation management.

## 2021-03-28 NOTE — Progress Notes (Signed)
Primary Care / Sports Medicine Office Visit  Patient Information:  Patient ID: Sarah Lowe, female DOB: 01-Aug-1980 Age: 41 y.o. MRN: [id number]   Sarah Lowe is a pleasant 41 y.o. 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    Better; GI contacted patient via letter 02/01/21, no appointment scheduled due to traveling; taking pantoprazole with relief   Sinus Problem    Intermittent sore throat since August, notes a cyst in left-side of throat; lesion left inner lip; has taken steroid injection, Solumedrol (August), and two rounds of amoxicillin (September and November) with no relief; 5/10 pain    Patient Active Problem List   Diagnosis Date Noted   Allergic rhinitis 03/28/2021   Oral ulceration 03/28/2021   Annual physical exam 01/31/2021   Absent reflex of lower extremity 01/31/2021   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     Independent interpretation of notes and tests performed by another provider:   None  Procedures performed:   Procedure:  Injection of left intra-articular knee under ultrasound guidance. Ultrasound  guidance utilized for needle placement, few of patellar tendon noted without clear pathology, no overt effusion noted, hypoechoic response from injectate visualized Samsung HS60 device utilized with permanent recording / reporting. Consent obtained and verified. Skin prepped in a sterile fashion. Ethyl chloride spray for topical local analgesia.  Completed without difficulty and tolerated well. Medication: triamcinolone acetonide 40 mg/mL suspension for injection 1 mL total and 2 mL lidocaine 1% without epinephrine utilized for needle placement anesthetic Advised to contact for fevers/chills, erythema, induration, drainage, or persistent bleeding.   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  Physical examination reveals benign abdominal findings with normoactive bowel sounds, no hepatosplenomegaly, nontender throughout without rebound.  She has yet to see gastroenterology, I have encouraged her to continue pantoprazole given her interval improvement and to maintain visit with GI for further evaluation management.  Allergic rhinitis Comorbid concern for allergic rhinitis in setting of recurrent maxillary sinusitis.  She has had to rise of antibiotics and a round of steroids over interval visits, has noted minor improvement though still has significant right frontal pain, severe at times, nasal congestion, oral ulceration, mild ear pain bilaterally.  Denies any fevers or chills currently.  She has upcoming visit with ENT scheduled for 04/2021.  Physical examination reveals scattered superficial oral ulcers in various stages of healing, appear to be aphthous ulcers, focal small 1x58mm nodule at the left tonsillar fossa, no exudate noted, mild cobblestone and injected appearance of the oropharynx.  Cardiopulmonary findings are benign.  She did note  benefit, though not  resolution, from previously recommended Flonase.  I have advised escalating to Qnasl scheduled until her follow-up with ENT, additionally antihistamine of her choosing daily.  She is to maintain follow-up with ENT, we will check on this issue in 2 weeks.  If symptoms fail to improve or worsen at the 2-week mark or beyond, she was advised to contact her office for additional therapy options.  Oral ulceration Has noted oral ulcerations, scattered about the lower lip, back of throat, denies history of cold sores.  Examination reveals scattered aphthous appearing ulcers of the lower buccal region in varying stages of healing.  Etiology can include viral/bacterial/fungal.  No clear signs of candidiasis.  Also be sequelae of continued GERD.  She does have upcoming visit with ENT, over the interim advised monitoring this issue and if symptoms fail to improve or worsen at the 2-week mark or beyond, can consider antiviral versus antibiotic regimen.   Orders & Medications Meds ordered this encounter  Medications   Beclomethasone Dipropionate (QNASL) 80 MCG/ACT AERS    Sig: Place 2 sprays into both nostrils daily.    Dispense:  1 each    Refill:  1   triamcinolone acetonide (KENALOG-40) injection 40 mg   pantoprazole (PROTONIX) 40 MG tablet    Sig: Take 1 tablet (40 mg total) by mouth daily.    Dispense:  30 tablet    Refill:  1   Orders Placed This Encounter  Procedures   [place] LIMITED JOINT SPACE STRUCTURES LOW LEFT     Return in about 2 months (around 05/26/2021).     Montel Culver, MD   Primary Care Sports Medicine [place]

## 2021-04-08 ENCOUNTER — Ambulatory Visit: Payer: BC Managed Care – PPO | Admitting: Family Medicine

## 2021-04-25 ENCOUNTER — Other Ambulatory Visit: Payer: Self-pay | Admitting: Family Medicine

## 2021-04-25 DIAGNOSIS — E559 Vitamin D deficiency, unspecified: Secondary | ICD-10-CM

## 2021-05-13 DIAGNOSIS — J342 Deviated nasal septum: Secondary | ICD-10-CM | POA: Diagnosis not present

## 2021-05-24 DIAGNOSIS — J328 Other chronic sinusitis: Secondary | ICD-10-CM | POA: Diagnosis not present

## 2021-05-24 DIAGNOSIS — J342 Deviated nasal septum: Secondary | ICD-10-CM | POA: Diagnosis not present

## 2021-05-24 DIAGNOSIS — J301 Allergic rhinitis due to pollen: Secondary | ICD-10-CM | POA: Diagnosis not present

## 2021-05-27 ENCOUNTER — Ambulatory Visit (INDEPENDENT_AMBULATORY_CARE_PROVIDER_SITE_OTHER): Payer: BC Managed Care – PPO | Admitting: Family Medicine

## 2021-05-27 ENCOUNTER — Encounter: Payer: Self-pay | Admitting: Family Medicine

## 2021-05-27 ENCOUNTER — Other Ambulatory Visit: Payer: Self-pay

## 2021-05-27 VITALS — BP 110/78 | HR 69 | Ht 68.0 in | Wt 250.0 lb

## 2021-05-27 DIAGNOSIS — M222X2 Patellofemoral disorders, left knee: Secondary | ICD-10-CM

## 2021-05-27 DIAGNOSIS — R1112 Projectile vomiting: Secondary | ICD-10-CM | POA: Diagnosis not present

## 2021-05-27 MED ORDER — DICLOFENAC SODIUM 75 MG PO TBEC: 75.0000 mg | DELAYED_RELEASE_TABLET | Freq: Two times a day (BID) | ORAL | 0 refills | Status: DC | PRN

## 2021-05-27 MED ORDER — PANTOPRAZOLE SODIUM 40 MG PO TBEC: 40.0000 mg | DELAYED_RELEASE_TABLET | Freq: Every day | ORAL | 0 refills | Status: DC

## 2021-05-27 NOTE — Patient Instructions (Signed)
-   Utilize ice, topical NSAID for additional pain control ?- For pain not responding to the above, dose diclofenac up to twice a day on as-needed basis (take with food) ?- Use Cho-Pat strap (as demonstrated), can order online if fit and issue ?- Referral coordinator will contact you in regards to visit with orthopedic surgeon ?- Maintain follow-up with ENT and schedule a visit with gastroenterology ?- Return for annual physical 01/2022 ?- Contact us for any questions between now and then ?

## 2021-05-27 NOTE — Assessment & Plan Note (Signed)
Patient presents for follow-up to left knee pain in the setting of patella alta, patellofemoral maltracking syndrome, prior patellar tendon surgery from 2017.  At the last visit on 03/28/2021, she tolerated left intra-articular corticosteroid injection.  Noted significant improvement but has since noted gradual recurrence of primarily sensation of instability without significant pain.  The pain that she does have it is superior patella, nonradiating, aggravated by physical activity (worse back pain, etc.). ? ?Examination reveals patella situated superiorly, dynamic maltracking with passive flexion/extension, tenderness about the quadriceps tendon, secondarily about the patella facets, medial joint line, provocative testing otherwise benign and she has no laxity with anterior/posterior valgus/varus stressing. ? ?Reviewed both surgical and nonsurgical treatment strategies, patient has requested further surgical evaluation, referral has been placed in that regard.  Over the interim, have advised patellar stabilizing brace for excellent pain control, and Rx meloxicam. ? ?Chronic condition, symptomatic, Rx management ?

## 2021-05-27 NOTE — Assessment & Plan Note (Signed)
Has demonstrated overall improvement for this chronic issue, still intermittently symptomatic.  I have advised a transition to as needed pantoprazole and following up with gastroenterology.  A referral had previously been placed in this regard. ? ?Chronic condition, stable, Rx management ?

## 2021-05-27 NOTE — Progress Notes (Signed)
?  ? ?  Primary Care / Sports Medicine Office Visit ? ?Patient Information:  ?Patient ID: Sarah Lowe, female DOB: April 02, 1980 Age: 41 y.o. MRN: [id number]  ? ?Sarah Lowe is a pleasant 41 y.o. female presenting with the following: ? ?Chief Complaint  ?Patient presents with  ? Patellofemoral syndrome, left  ?  Doing better, the injection helped a lot, the past 2 weeks leg is starting to buckle some but not as bad as before   ? ? ?Vitals:  ? 05/27/21 0950  ?BP: 110/78  ?Pulse: 69  ?SpO2: 99%  ? ?Vitals:  ? 05/27/21 0950  ?Weight: 250 lb (113.4 kg)  ?Height: 5\' 8"  (1.727 m)  ? ?Body mass index is 38.01 kg/m?. ? ?No results found.  ? ?Independent interpretation of notes and tests performed by another provider:  ? ?None ? ?Procedures performed:  ? ?None ? ?Pertinent History, Exam, Impression, and Recommendations:  ? 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  I have advised a transition to as needed pantoprazole and following up with gastroenterology.  A referral had previously been placed in this regard. ? ?Chronic condition, stable, Rx management ? ?Patellofemoral syndrome, left ?Patient presents for follow-up to left knee pain in the setting of patella alta, patellofemoral maltracking syndrome, prior patellar tendon surgery from 2017.  At the last visit on 03/28/2021, she tolerated left intra-articular corticosteroid injection.  Noted significant improvement but has since noted gradual recurrence of primarily sensation of instability without significant pain.  The pain that she does have it is superior patella, nonradiating, aggravated by physical activity (worse back pain, etc.). ? ?Examination reveals patella situated superiorly, dynamic maltracking with passive flexion/extension, tenderness about the quadriceps tendon, secondarily about the patella facets, medial joint line, provocative testing otherwise benign and  she has no laxity with anterior/posterior valgus/varus stressing. ? ?Reviewed both surgical and nonsurgical treatment strategies, patient has requested further surgical evaluation, referral has been placed in that regard.  Over the interim, have advised patellar stabilizing brace for excellent pain control, and Rx meloxicam. ? ?Chronic condition, symptomatic, Rx management  ? ?Orders & Medications ?Meds ordered this encounter  ?Medications  ? pantoprazole (PROTONIX) 40 MG tablet  ?  Sig: Take 1 tablet (40 mg total) by mouth daily.  ?  Dispense:  30 tablet  ?  Refill:  0  ? diclofenac (VOLTAREN) 75 MG EC tablet  ?  Sig: Take 1 tablet (75 mg total) by mouth 2 (two) times daily as needed.  ?  Dispense:  30 tablet  ?  Refill:  0  ? ?Orders Placed This Encounter  ?Procedures  ? Ambulatory referral to Orthopedic Surgery  ?  ? ?Return in about 36 weeks (around 02/03/2022) for Annual Physical.  ?  ? ?02/05/2022, MD ? ? Primary Care Sports Medicine ?[facility] ?[facility] [place]  ? ?

## 2021-05-31 ENCOUNTER — Ambulatory Visit: Payer: BC Managed Care – PPO | Admitting: Cardiology

## 2021-06-25 ENCOUNTER — Telehealth: Payer: Self-pay

## 2021-06-25 NOTE — Telephone Encounter (Signed)
Called patient to see if she can recall the name of the Cardiologist she was seen by in [place]. No answer. LMOV to call back.  ? ?

## 2021-07-01 ENCOUNTER — Encounter: Payer: Self-pay | Admitting: Cardiology

## 2021-07-01 ENCOUNTER — Ambulatory Visit (INDEPENDENT_AMBULATORY_CARE_PROVIDER_SITE_OTHER): Payer: BC Managed Care – PPO

## 2021-07-01 ENCOUNTER — Ambulatory Visit: Payer: BC Managed Care – PPO | Admitting: Cardiology

## 2021-07-01 VITALS — BP 120/80 | HR 82 | Ht 68.0 in | Wt 250.0 lb

## 2021-07-01 DIAGNOSIS — E7800 Pure hypercholesterolemia, unspecified: Secondary | ICD-10-CM

## 2021-07-01 DIAGNOSIS — R002 Palpitations: Secondary | ICD-10-CM | POA: Diagnosis not present

## 2021-07-01 DIAGNOSIS — I38 Endocarditis, valve unspecified: Secondary | ICD-10-CM

## 2021-07-01 DIAGNOSIS — Z6838 Body mass index (BMI) 38.0-38.9, adult: Secondary | ICD-10-CM

## 2021-07-01 NOTE — Progress Notes (Signed)
?Cardiology Office Note:   ? ?Date:  07/01/2021  ? ?ID:  Sarah Lowe, DOB 05/27/1980, MRN [id number] ? ?PCP:  Jerrol BananaMatthews, Jason J, MD ?  ?[facility] Providers ?Cardiologist:  Debbe OdeaBrian Agbor-Etang, MD    ? ?Referring MD: Jerrol BananaMatthews, Jason J, MD  ? ?Chief Complaint  ?Patient presents with  ? NEW patient-Establish cardiac care (moved from [place])  ?  Patient reports elevated heart rate readings without exertion and SOB. It seems to return to normal after lying down. She states she has had elevated cholesterol readings recently as well. Patient reports hx of abnormal EKG and states she had an U/S which showed "2 leaky valves". She also notes she wore a heart monitor in the past which was "normal".   ? ? ?History of Present Illness:   ? ?Sarah Lowe is a 41 y.o. female with a hx of hyperlipidemia who presents due to palpitations.   ? ?Patient states symptoms of rapid heartbeats have been ongoing over the past 3 to 4 months.  Symptoms alcohol about twice a week, sometimes associated with shortness of breath and chest pressure.  She endorses shortness of breath with exertion, denies any personal history of heart disease.  She was previously evaluated for palpitations in [place] in [place]   ? ?Cardiac monitor and echocardiogram were grossly unremarkable, valvular insufficiency noted on echo.  Patient was advised valvular insufficiency will be monitored long-term.  She used to be very active prior to having bilateral patellar tendon rupture in 2017. ? ? ?Past Medical History:  ?Diagnosis Date  ? Allergy   ? ? ?Past Surgical History:  ?Procedure Laterality Date  ? FINGER MASS EXCISION Right 2021  ? fourth finger  ? GUM SURGERY  2015  ? PATELLAR TENDON REPAIR Bilateral 2017  ? WISDOM TOOTH EXTRACTION Bilateral 2001  ? ? ?Current Medications: ?Current Meds  ?Medication Sig  ? Beclomethasone Dipropionate (QNASL) 80 MCG/ACT AERS Place 2 sprays into both nostrils daily.  ? Diclofenac Sodium 2 % SOLN Place 2 sprays  onto the skin 2 (two) times daily as needed (pain).  ? EPINEPHrine 0.3 mg/0.3 mL IJ SOAJ injection Inject 0.3 mg into the muscle once as needed.  ?  ? ?Allergies:   Patient has no known allergies.  ? ?Social History  ? ?Socioeconomic History  ? Marital status: Married  ?  Spouse name: Valda LambRobert Mason  ? Number of children: 0  ? Years of education: 7116  ? Highest education level: Bachelor's degree (e.g., BA, AB, BS)  ?Occupational History  ? Not on file  ?Tobacco Use  ? Smoking status: Never  ? Smokeless tobacco: Never  ?Vaping Use  ? Vaping Use: Never used  ?Substance and Sexual Activity  ? Alcohol use: Yes  ?  Alcohol/week: 3.0 standard drinks  ?  Types: 3 Standard drinks or equivalent per week  ? Drug use: Never  ? Sexual activity: Yes  ?  Partners: Male  ?  Birth control/protection: None  ?Other Topics Concern  ? Not on file  ?Social History Narrative  ? Not on file  ? ?Social Determinants of Health  ? ?Financial Resource Strain: Not on file  ?Food Insecurity: Not on file  ?Transportation Needs: Not on file  ?Physical Activity: Not on file  ?Stress: Not on file  ?Social Connections: Not on file  ?  ? ?Family History: ?The patient's family history includes Anemia in her mother; Anuerysm in her maternal grandfather and paternal grandfather; Cancer in her mother; Colon cancer  in her maternal grandmother; Heart attack in her paternal grandmother; Hypertension in her father; Other in her father; Stroke in her paternal grandmother. ? ?ROS:   ?Please see the history of present illness.    ? All other systems reviewed and are negative. ? ?EKGs/Labs/Other Studies Reviewed:   ? ?The following studies were reviewed today: ? ? ?EKG:  EKG is  ordered today.  The ekg ordered today demonstrates normal sinus rhythm, normal ECG ? ?Recent Labs: ?01/31/2021: ALT 17; BUN 11; Creatinine, Ser 0.92; Hemoglobin 12.5; Platelets 245; Potassium 4.4; Sodium 137; TSH 1.220  ?Recent Lipid Panel ?   ?Component Value Date/Time  ? CHOL 218 (H)  01/31/2021 1057  ? TRIG 70 01/31/2021 1057  ? HDL 50 01/31/2021 1057  ? CHOLHDL 4.4 01/31/2021 1057  ? LDLCALC 156 (H) 01/31/2021 1057  ? ? ? ?Risk Assessment/Calculations:   ? ? ?    ? ?Physical Exam:   ? ?VS:  BP 120/80 (BP Location: Right Arm, Patient Position: Sitting, Cuff Size: Large)   Pulse 82   Ht 5\' 8"  (1.727 m)   Wt 250 lb (113.4 kg)   SpO2 99%   BMI 38.01 kg/m?    ? ?Wt Readings from Last 3 Encounters:  ?07/01/21 250 lb (113.4 kg)  ?05/27/21 250 lb (113.4 kg)  ?03/28/21 253 lb (114.8 kg)  ?  ? ?GEN:  Well nourished, well developed in no acute distress ?HEENT: Normal ?NECK: No JVD; No carotid bruits ?LYMPHATICS: No lymphadenopathy ?CARDIAC: RRR, 1/6 systolic murmur left sternal border ?RESPIRATORY:  Clear to auscultation without rales, wheezing or rhonchi  ?ABDOMEN: Soft, non-tender, non-distended ?MUSCULOSKELETAL:  No edema; No deformity  ?SKIN: Warm and dry ?NEUROLOGIC:  Alert and oriented x 3 ?PSYCHIATRIC:  Normal affect  ? ?ASSESSMENT:   ? ?1. Palpitation   ?2. Heart valve insufficiency   ?3. Pure hypercholesterolemia   ?4. BMI 38.0-38.9,adult   ? ?PLAN:   ? ?In order of problems listed above: ? ?Palpitations usually associated with exertion, place cardiac monitor to evaluate any significant arrhythmias.  Deconditioning could be contributing to elevated heart rates. ?History of valvular regurgitation, systolic murmur on exam.  Get echocardiogram. ?Hyperlipidemia, obtain specific profile. ?Obesity, low-calorie diet, weight loss advised. ? ? ?Follow-up in 6 weeks.  If cardiac work-up is unremarkable, FOCUS should be on weight loss and conditioning. ? ?   ? ?Medication Adjustments/Labs and Tests Ordered: ?Current medicines are reviewed at length with the patient today.  Concerns regarding medicines are outlined above.  ?Orders Placed This Encounter  ?Procedures  ? Lipid panel  ? LONG TERM MONITOR (3-14 DAYS)  ? EKG 12-Lead  ? ECHOCARDIOGRAM COMPLETE  ? ?No orders of the defined types were placed in  this encounter. ? ? ?Patient Instructions  ?Medication Instructions:  ? ?Your physician recommends that you continue on your current medications as directed. Please refer to the Current Medication list given to you today. ? ?*If you need a refill on your cardiac medications before your next appointment, please call your pharmacy* ? ? ?Lab Work: ? ?Your physician recommends that you return for lab work when you are able to at the medical mall. You will need to be fasting.  ?No appt is needed. Hours are M-F 7AM- 6 PM. ? ?If you have labs (blood work) drawn today and your tests are completely normal, you will receive your results only by: ?MyChart Message (if you have MyChart) OR ?A paper copy in the mail ?If you have any lab test  that is abnormal or we need to change your treatment, we will call you to review the results. ? ? ?Testing/Procedures: ? ?Echocardiogram ?Please return to [facility] on ______________ at _______________ AM/PM for an Echocardiogram. ?Your physician has requested that you have an echocardiogram. Echocardiography is a painless test that uses sound waves to create images of your heart. It provides your doctor with information about the size and shape of your heart and how well your heart?s chambers and valves are working. This procedure takes approximately one hour. There are no restrictions for this procedure. Please note; depending on visual quality an IV may need to be placed.  ? ?2. Your provider has ordered a heart monitor to wear for 14 days. This will be mailed to your home with instructions on placement. Once you have finished the time frame requested, you will return monitor in box provided. ? ? ? ? ?Follow-Up: ?At [facility], you and your health needs are our priority.  As part of our continuing mission to provide you with exceptional heart care, we have created designated Provider Care Teams.  These Care Teams include your primary Cardiologist (physician) and  Advanced Practice Providers (APPs -  Physician Assistants and Nurse Practitioners) who all work together to provide you with the care you need, when you need it. ? ?We recommend signing up for the patient portal called "

## 2021-07-01 NOTE — Patient Instructions (Signed)
Medication Instructions:  ? ?Your physician recommends that you continue on your current medications as directed. Please refer to the Current Medication list given to you today. ? ?*If you need a refill on your cardiac medications before your next appointment, please call your pharmacy* ? ? ?Lab Work: ? ?Your physician recommends that you return for lab work when you are able to at the medical mall. You will need to be fasting.  ?No appt is needed. Hours are M-F 7AM- 6 PM. ? ?If you have labs (blood work) drawn today and your tests are completely normal, you will receive your results only by: ?MyChart Message (if you have MyChart) OR ?A paper copy in the mail ?If you have any lab test that is abnormal or we need to change your treatment, we will call you to review the results. ? ? ?Testing/Procedures: ? ?Echocardiogram ?Please return to [facility] on ______________ at _______________ AM/PM for an Echocardiogram. ?Your physician has requested that you have an echocardiogram. Echocardiography is a painless test that uses sound waves to create images of your heart. It provides your doctor with information about the size and shape of your heart and how well your heart?s chambers and valves are working. This procedure takes approximately one hour. There are no restrictions for this procedure. Please note; depending on visual quality an IV may need to be placed.  ? ?2. Your provider has ordered a heart monitor to wear for 14 days. This will be mailed to your home with instructions on placement. Once you have finished the time frame requested, you will return monitor in box provided. ? ? ? ? ?Follow-Up: ?At [facility], you and your health needs are our priority.  As part of our continuing mission to provide you with exceptional heart care, we have created designated Provider Care Teams.  These Care Teams include your primary Cardiologist (physician) and Advanced Practice Providers (APPs -  Physician  Assistants and Nurse Practitioners) who all work together to provide you with the care you need, when you need it. ? ?We recommend signing up for the patient portal called "MyChart".  Sign up information is provided on this After Visit Summary.  MyChart is used to connect with patients for Virtual Visits (Telemedicine).  Patients are able to view lab/test results, encounter notes, upcoming appointments, etc.  Non-urgent messages can be sent to your provider as well.   ?To learn more about what you can do with MyChart, go to [email or web address].   ? ?Your next appointment:   ?6 - 8  week(s) ? ?The format for your next appointment:   ?In Person ? ?Provider:   ?You may see Kate Sable, MD or one of the following Advanced Practice Providers on your designated Care Team:   ?Murray Hodgkins, NP ?Christell Faith, PA-C ?Cadence Kathlen Mody, PA-C ? ? ?Important Information About Sugar ? ? ? ? ? ? ?

## 2021-07-05 DIAGNOSIS — M25562 Pain in left knee: Secondary | ICD-10-CM | POA: Diagnosis not present

## 2021-07-05 DIAGNOSIS — Q682 Congenital deformity of knee: Secondary | ICD-10-CM | POA: Diagnosis not present

## 2021-07-05 DIAGNOSIS — Z1239 Encounter for other screening for malignant neoplasm of breast: Secondary | ICD-10-CM | POA: Diagnosis not present

## 2021-07-05 DIAGNOSIS — G8929 Other chronic pain: Secondary | ICD-10-CM | POA: Diagnosis not present

## 2021-07-05 DIAGNOSIS — M25561 Pain in right knee: Secondary | ICD-10-CM | POA: Diagnosis not present

## 2021-07-05 DIAGNOSIS — Z1231 Encounter for screening mammogram for malignant neoplasm of breast: Secondary | ICD-10-CM | POA: Diagnosis not present

## 2021-07-07 DIAGNOSIS — R002 Palpitations: Secondary | ICD-10-CM

## 2021-07-21 ENCOUNTER — Other Ambulatory Visit: Payer: Self-pay

## 2021-07-21 ENCOUNTER — Ambulatory Visit
Admission: RE | Admit: 2021-07-21 | Discharge: 2021-07-21 | Disposition: A | Discharge status: Home / Self Care | Payer: BC Managed Care – PPO | Source: Ambulatory Visit | Attending: Physician Assistant | Admitting: Physician Assistant

## 2021-07-21 VITALS — BP 124/84 | HR 71 | Temp 98.2°F | Resp 14 | Ht 68.0 in | Wt 250.0 lb

## 2021-07-21 DIAGNOSIS — J302 Other seasonal allergic rhinitis: Secondary | ICD-10-CM | POA: Diagnosis not present

## 2021-07-21 DIAGNOSIS — J029 Acute pharyngitis, unspecified: Secondary | ICD-10-CM

## 2021-07-21 DIAGNOSIS — R0981 Nasal congestion: Secondary | ICD-10-CM

## 2021-07-21 LAB — GROUP A STREP BY PCR: Group A Strep by PCR: NOT DETECTED

## 2021-07-21 MED ORDER — LIDOCAINE VISCOUS HCL 2 % MT SOLN: 15.0000 mL | OROMUCOSAL | 0 refills | Status: DC | PRN

## 2021-07-21 NOTE — ED Provider Notes (Signed)
?[facility] ? ? ? ?CSN: [id number] ?Arrival date & time: 07/21/21  1052 ? ? ?  ? ?History   ?Chief Complaint ?Chief Complaint  ?Patient presents with  ? Sore Throat  ?  APPOINTMENT  ? ? ?HPI ?Jeniyah Menor is a 41 y.o. female presenting for 4-day history of significant sore throat, postnasal drainage, nasal congestion without drainage.  She denies associated fever, fatigue, cough, breathing difficulty, vomiting or diarrhea.  Patient does report some headaches but says that got better with Claritin.  History of allergies.  Has taken 2 COVID test at home and they have been negative.  Has been also using Flonase in addition to the Claritin.  No sick contacts.  No other complaints. ? ?HPI ? ?Past Medical History:  ?Diagnosis Date  ? Allergy   ? ? ?Patient Active Problem List  ? Diagnosis Date Noted  ? Allergic rhinitis 03/28/2021  ? Oral ulceration 03/28/2021  ? Annual physical exam 01/31/2021  ? Absent reflex of lower extremity 01/31/2021  ? 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 Patellofemoral syndrome, left 12/31/2020  ? Insect sting allergy, current reaction, accidental or unintentional, sequela 12/31/2020  ? Acute non-recurrent maxillary sinusitis 12/31/2020  ? PCOS (polycystic ovarian syndrome) 12/31/2020  ? Volar retinacular ganglion 02/14/2019  ? 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 Sprain and strain of other specified sites of knee and leg 01/07/2016  ? ? ?Past Surgical History:  ?Procedure Laterality Date  ? FINGER MASS EXCISION Right 2021  ? fourth finger  ? GUM SURGERY  2015  ? PATELLAR TENDON REPAIR Bilateral 2017  ? WISDOM TOOTH EXTRACTION Bilateral 2001  ? ? ?OB History   ? ? Gravida  ?1  ? Para  ?   ? Term  ?   ? Preterm  ?   ? AB  ?1  ? Living  ?   ?  ? ? SAB  ?1  ? IAB  ?   ? Ectopic  ?   ? Multiple  ?   ? Live Births  ?   ?   ?  ?  ? ? ? ?Home Medications   ? ?Prior to Admission medications   ?Medication Sig Start Date End Date Taking? Authorizing Provider  ?lidocaine (XYLOCAINE) 2 %  solution Use as directed 15 mLs in the mouth or throat every 3 (three) hours as needed for mouth pain (swish and spit). 07/21/21  Yes Shirlee Latch, PA-C  ?Beclomethasone Dipropionate (QNASL) 80 MCG/ACT AERS Place 2 sprays into both nostrils daily. 03/28/21   Jerrol Banana, MD  ?Diclofenac Sodium 2 % SOLN Place 2 sprays onto the skin 2 (two) times daily as needed (pain). 01/31/21   Jerrol Banana, MD  ?EPINEPHrine 0.3 mg/0.3 mL IJ SOAJ injection Inject 0.3 mg into the muscle once as needed. 09/30/20   [provider]  ? ? ?Family History ?Family History  ?Problem Relation Age of Onset  ? Cancer Mother   ? Anemia Mother   ? Hypertension Father   ? Other Father   ?     Agent Orange  ? Colon cancer Maternal Grandmother   ? Anuerysm Maternal Grandfather   ? Heart attack Paternal Grandmother   ? Stroke Paternal Grandmother   ? Anuerysm Paternal Grandfather   ? ? ?Social History ?Social History  ? ?Tobacco Use  ? Smoking status: Never  ? Smokeless tobacco: Never  ?Vaping Use  ? Vaping Use: Never used  ?Substance Use Topics  ?  Alcohol use: Yes  ?  Alcohol/week: 3.0 standard drinks  ?  Types: 3 Standard drinks or equivalent per week  ? Drug use: Never  ? ? ? ?Allergies   ?Patient has no known allergies. ? ? ?Review of Systems ?Review of Systems  ?Constitutional:  Negative for chills, diaphoresis, fatigue and fever.  ?HENT:  Positive for congestion, postnasal drip, rhinorrhea and sore throat. Negative for ear pain, sinus pressure and sinus pain.   ?Respiratory:  Negative for cough and shortness of breath.   ?Gastrointestinal:  Negative for abdominal pain, nausea and vomiting.  ?Musculoskeletal:  Negative for arthralgias and myalgias.  ?Skin:  Negative for rash.  ?Neurological:  Positive for headaches. Negative for weakness.  ?Hematological:  Negative for adenopathy.  ? ? ?Physical Exam ?Triage Vital Signs ?ED Triage Vitals  ?Enc Vitals Group  ?   BP --   ?   Pulse --   ?   Resp --   ?   Temp --   ?   Temp src  --   ?   SpO2 --   ?   Weight 07/21/21 1110 250 lb (113.4 kg)  ?   Height 07/21/21 1110 5\' 8"  (1.727 m)  ?   Head Circumference --   ?   Peak Flow --   ?   Pain Score 07/21/21 1109 6  ?   Pain Loc --   ?   Pain Edu? --   ?   Excl. in GC? --   ? ?No data found. ? ?Updated Vital Signs ?BP 124/84 (BP Location: Right Arm)   Pulse 71   Temp 98.2 ?F (36.8 ?C) (Oral)   Resp 14   Ht 5\' 8"  (1.727 m)   Wt 250 lb (113.4 kg)   LMP 07/07/2021 (Approximate)   SpO2 97%   BMI 38.01 kg/m?  ?  ? ?Physical Exam ?Vitals and nursing note reviewed.  ?Constitutional:   ?   General: She is not in acute distress. ?   Appearance: Normal appearance. She is not ill-appearing or toxic-appearing.  ?HENT:  ?   Head: Normocephalic and atraumatic.  ?   Nose: Congestion present.  ?   Mouth/Throat:  ?   Mouth: Mucous membranes are moist.  ?   Pharynx: Oropharynx is clear. Posterior oropharyngeal erythema (with clear PND) present.  ?Eyes:  ?   General: No scleral icterus.    ?   Right eye: No discharge.     ?   Left eye: No discharge.  ?   Conjunctiva/sclera: Conjunctivae normal.  ?Cardiovascular:  ?   Rate and Rhythm: Normal rate and regular rhythm.  ?   Heart sounds: Normal heart sounds.  ?Pulmonary:  ?   Effort: Pulmonary effort is normal. No respiratory distress.  ?   Breath sounds: Normal breath sounds.  ?Musculoskeletal:  ?   Cervical back: Neck supple.  ?Skin: ?   General: Skin is dry.  ?Neurological:  ?   General: No focal deficit present.  ?   Mental Status: She is alert. Mental status is at baseline.  ?   Motor: No weakness.  ?   Gait: Gait normal.  ?Psychiatric:     ?   Mood and Affect: Mood normal.     ?   Behavior: Behavior normal.     ?   Thought Content: Thought content normal.  ? ? ? ?UC Treatments / Results  ?Labs ?(all labs ordered are listed, but only abnormal results are displayed) ?  Labs Reviewed  ?GROUP A STREP BY PCR  ? ? ?EKG ? ? ?Radiology ?No results found. ? ?Procedures ?Procedures (including critical care  time) ? ?Medications Ordered in UC ?Medications - No data to display ? ?Initial Impression / Assessment and Plan / UC Course  ?I have reviewed the triage vital signs and the nursing notes. ? ?Pertinent labs & imaging results that were available during my care of the patient were reviewed by me and considered in my medical decision making (see chart for details). ? ?41 year old female presenting for 4-day history of sore throat, postnasal drainage and nasal congestion.  History of allergies.  Patient concerned because her sore throat feels worse than it typically does.  Taking Claritin and using Flonase.  Vitals normal and stable.  Patient overall well-appearing.  Nasal congestion present without drainage, erythema posterior pharynx with clear postnasal drainage.  Chest clear to auscultation heart regular rate and rhythm.  Patient already took 2 home COVID test which were negative.  PCR strep test is negative here today.  Advised her symptoms likely consistent with her allergies.  Advised switching to Claritin-D and adding Sudafed, ibuprofen.  Sent viscous lidocaine.  Advised also Chloraseptic spray, throat lozenges and plenty of fluids.  Follow-up as needed. ? ? ?Final Clinical Impressions(s) / UC Diagnoses  ? ?Final diagnoses:  ?Sore throat  ?Seasonal allergies  ?Nasal congestion  ? ? ? ?Discharge Instructions   ? ?  ?-Negative strep test. ?- Symptoms likely related to postnasal drainage and or allergies. ?- Switch to Claritin-D.  Can also add Sudafed PE and ibuprofen.  I have sent viscous lidocaine.  Can also add Chloraseptic spray, throat lozenges and make sure to drink plenty of fluids. ? ? ? ? ?ED Prescriptions   ? ? Medication Sig Dispense Auth. Provider  ? lidocaine (XYLOCAINE) 2 % solution Use as directed 15 mLs in the mouth or throat every 3 (three) hours as needed for mouth pain (swish and spit). 100 mL Shirlee LatchEaves, Nadiyah Zeis B, PA-C  ? ?  ? ?PDMP not reviewed this encounter. ?  ?Shirlee Latchaves, Kymere Fullington B, PA-C ?07/21/21  1156 ? ?

## 2021-07-21 NOTE — ED Triage Notes (Signed)
Patient c/o sore throat and pain when swallowing that started on Wed.  Patient reports stuffy nose that started yesterday.  Patient denies fevers.  Patient took two covid tests one Wed and one on Friday and both were negative.   ?

## 2021-07-21 NOTE — Discharge Instructions (Addendum)
-  Negative strep test. ?- Symptoms likely related to postnasal drainage and or allergies. ?- Switch to Claritin-D.  Can also add Sudafed PE and ibuprofen.  I have sent viscous lidocaine.  Can also add Chloraseptic spray, throat lozenges and make sure to drink plenty of fluids. ?

## 2021-07-25 DIAGNOSIS — R002 Palpitations: Secondary | ICD-10-CM | POA: Diagnosis not present

## 2021-07-29 ENCOUNTER — Other Ambulatory Visit
Admission: RE | Admit: 2021-07-29 | Discharge: 2021-07-29 | Disposition: A | Discharge status: Home / Self Care | Payer: BC Managed Care – PPO | Attending: Physician Assistant | Admitting: Physician Assistant

## 2021-07-29 ENCOUNTER — Ambulatory Visit (INDEPENDENT_AMBULATORY_CARE_PROVIDER_SITE_OTHER): Payer: BC Managed Care – PPO

## 2021-07-29 DIAGNOSIS — J328 Other chronic sinusitis: Secondary | ICD-10-CM | POA: Diagnosis not present

## 2021-07-29 DIAGNOSIS — M25562 Pain in left knee: Secondary | ICD-10-CM | POA: Diagnosis not present

## 2021-07-29 DIAGNOSIS — I38 Endocarditis, valve unspecified: Secondary | ICD-10-CM | POA: Diagnosis not present

## 2021-07-29 DIAGNOSIS — E7800 Pure hypercholesterolemia, unspecified: Secondary | ICD-10-CM | POA: Diagnosis not present

## 2021-07-29 DIAGNOSIS — J301 Allergic rhinitis due to pollen: Secondary | ICD-10-CM | POA: Diagnosis not present

## 2021-07-29 DIAGNOSIS — Q682 Congenital deformity of knee: Secondary | ICD-10-CM | POA: Diagnosis not present

## 2021-07-29 DIAGNOSIS — J342 Deviated nasal septum: Secondary | ICD-10-CM | POA: Diagnosis not present

## 2021-07-29 DIAGNOSIS — M25561 Pain in right knee: Secondary | ICD-10-CM | POA: Diagnosis not present

## 2021-07-29 DIAGNOSIS — G8929 Other chronic pain: Secondary | ICD-10-CM | POA: Diagnosis not present

## 2021-07-29 LAB — ECHOCARDIOGRAM COMPLETE
AR max vel: 2.49 cm2
AV Area VTI: 2.37 cm2
AV Area mean vel: 2.16 cm2
AV Mean grad: 4 mmHg
AV Peak grad: 6.1 mmHg
Ao pk vel: 1.23 m/s
Area-P 1/2: 2.68 cm2
Calc EF: 68.7 %
S' Lateral: 2.6 cm
Single Plane A2C EF: 68.7 %
Single Plane A4C EF: 68.5 %

## 2021-07-29 LAB — LIPID PANEL
Cholesterol: 201 mg/dL — ABNORMAL HIGH (ref 0–200)
HDL: 46 mg/dL (ref >40)
LDL Cholesterol: 141 mg/dL — ABNORMAL HIGH (ref 0–99)
Total CHOL/HDL Ratio: 4.4 RATIO
Triglycerides: 70 mg/dL (ref <150)
VLDL: 14 mg/dL (ref 0–40)

## 2021-08-02 DIAGNOSIS — K047 Periapical abscess without sinus: Secondary | ICD-10-CM | POA: Diagnosis not present

## 2021-08-02 DIAGNOSIS — J029 Acute pharyngitis, unspecified: Secondary | ICD-10-CM | POA: Diagnosis not present

## 2021-08-05 NOTE — Discharge Instructions (Signed)
?[facility] ?[facility] ?ENDOSCOPIC SINUS SURGERY ?[facility] ? ?What is Functional Endoscopic Sinus Surgery? ? The Surgery involves making the natural openings of the sinuses larger by removing the bony partitions that separate the sinuses from the nasal cavity.  The natural sinus lining is preserved as much as possible to allow the sinuses to resume normal function after the surgery.  In some patients nasal polyps (excessively swollen lining of the sinuses) may be removed to relieve obstruction of the sinus openings.  The surgery is performed through the nose using lighted scopes, which eliminates the need for incisions on the face.  A septoplasty is a different procedure which is sometimes performed with sinus surgery.  It involves straightening the boy partition that separates the two sides of your nose.  A crooked or deviated septum may need repair if is obstructing the sinuses or nasal airflow.  Turbinate reduction is also often performed during sinus surgery.  The turbinates are bony proturberances from the side walls of the nose which swell and can obstruct the nose in patients with sinus and allergy problems.  Their size can be surgically reduced to help relieve nasal obstruction. ? ?What Can Sinus Surgery Do For Me? ? Sinus surgery can reduce the frequency of sinus infections requiring antibiotic treatment.  This can provide improvement in nasal congestion, post-nasal drainage, facial pressure and nasal obstruction.  Surgery will NOT prevent you from ever having an infection again, so it usually only for patients who get infections 4 or more times yearly requiring antibiotics, or for infections that do not clear with antibiotics.  It will not cure nasal allergies, so patients with allergies may still require medication to treat their allergies after surgery. Surgery may improve headaches related to sinusitis, however, some people will continue to  require medication to control sinus headaches related to allergies.  Surgery will do nothing for other forms of headache (migraine, tension or cluster). ? ?What Are the Risks of Endoscopic Sinus Surgery? ? Current techniques allow surgery to be performed safely with little risk, however, there are rare complications that patients should be aware of.  Because the sinuses are located around the eyes, there is risk of eye injury, including blindness, though again, this would be quite rare. This is usually a result of bleeding behind the eye during surgery, which can effect vision, though there are treatments to protect the vision and prevent permanent injury. More serious complications would include bleeding inside the brain cavity or damage to the brain.This happens when the fluid around the brain leaks out into the sinus cavity.  Again, all of these complications are uncommon, and spinal fluid leaks can be safely managed surgically if they occur.  The most common complication of sinus surgery is bleeding from the nose, which may require packing or cauterization of the nose.  Patients with polyps may experience recurrence of the polyps that would require revision surgery.  Alterations of sense of smell or injury to the tear ducts are also rare complications.  ? ?What is the Surgery Like, and what is the Recovery? ? The Surgery usually takes a couple of hours to perform, and is usually performed under a general anesthetic (completely asleep).  Patients are usually discharged home after a couple of hours.  Sometimes during surgery it is necessary to pack the nose to control bleeding, and the packing is left in place for 24 - 48 hours, and removed by your surgeon.  If   a septoplasty was performed during the procedure, there is often a splint placed which must be removed after 5-7 days.   ?Discomfort: Pain is usually mild to moderate, and can be controlled by prescription pain medication or acetaminophen (Tylenol).   Aspirin, Ibuprofen (Advil, Motrin), or Naprosyn (Aleve) should be avoided, as they can cause increased bleeding.  Most patients feel sinus pressure like they have a bad head cold for several days.  Sleeping with your head elevated can help reduce swelling and facial pressure, as can ice packs over the face.  A humidifier may be helpful to keep the mucous and blood from drying in the nose.  ? ?Diet: There are no specific diet restrictions, however, you should generally start with clear liquids and a light diet of bland foods because the anesthetic can cause some nausea.  Advance your diet depending on how your stomach feels.  Taking your pain medication with food will often help reduce stomach upset which pain medications can cause. ? ?Nasal Saline Irrigation: It is important to remove blood clots and dried mucous from the nose as it is healing.  This is done by having you irrigate the nose at least 3 - 4 times daily with a salt water solution.  We recommend using NeilMed Sinus Rinse (available at the drug store).  Fill the squeeze bottle with the solution, bend over a sink, and insert the tip of the squeeze bottle into the nose ? of an inch.  Point the tip of the squeeze bottle towards the inside corner of the eye on the same side your irrigating.  Squeeze the bottle and gently irrigate the nose.  If you bend forward as you do this, most of the fluid will flow back out of the nose, instead of down your throat.   The solution should be warm, near body temperature, when you irrigate.   Each time you irrigate, you should use a full squeeze bottle.  ? ?Note that if you are instructed to use Nasal Steroid Sprays at any time after your surgery, irrigate with saline BEFORE using the steroid spray, so you do not wash it all out of the nose. ?Another product, Nasal Saline Gel (such as AYR Nasal Saline Gel) can be applied in each nostril 3 - 4 times daily to moisture the nose and reduce scabbing or crusting. ? ?Bleeding:   Bloody drainage from the nose can be expected for several days, and patients are instructed to irrigate their nose frequently with salt water to help remove mucous and blood clots.  The drainage may be dark red or brown, though some fresh blood may be seen intermittently, especially after irrigation.  Do not blow you nose, as bleeding may occur. If you must sneeze, keep your mouth open to allow air to escape through your mouth. ? ?If heavy bleeding occurs: Irrigate the nose with saline to rinse out clots, then spray the nose 3 - 4 times with Afrin Nasal Decongestant Spray.  The spray will constrict the blood vessels to slow bleeding.  Pinch the lower half of your nose shut to apply pressure, and lay down with your head elevated.  Ice packs over the nose may help as well. If bleeding persists despite these measures, you should notify your doctor.  Do not use the Afrin routinely to control nasal congestion after surgery, as it can result in worsening congestion and may affect healing.  ? ? ? ?Activity: Return to work varies among patients. Most patients will be out   of work at least 5 - 7 days to recover.  Patient may return to work after they are off of narcotic pain medication, and feeling well enough to perform the functions of their job.  Patients must avoid heavy lifting (over 10 pounds) or strenuous physical for 2 weeks after surgery, so your employer may need to assign you to light duty, or keep you out of work longer if light duty is not possible.  NOTE: you should not drive, operate dangerous machinery, do any mentally demanding tasks or make any important legal or financial decisions while on narcotic pain medication and recovering from the general anesthetic.  ?  ?Call Your Doctor Immediately if You Have Any of the Following: ?Bleeding that you cannot control with the above measures ?Loss of vision, double vision, bulging of the eye or black eyes. ?Fever over 101 degrees ?Neck stiffness with severe headache,  fever, nausea and change in mental state. ?You are always encouraged to call anytime with concerns, however, please call with requests for pain medication refills during office hours. ? ?Office Endoscopy: [facility]

## 2021-08-06 DIAGNOSIS — M2242 Chondromalacia patellae, left knee: Secondary | ICD-10-CM | POA: Diagnosis not present

## 2021-08-07 ENCOUNTER — Encounter: Payer: Self-pay | Admitting: Otolaryngology

## 2021-08-15 ENCOUNTER — Ambulatory Visit
Admission: RE | Admit: 2021-08-15 | Discharge: 2021-08-15 | Disposition: A | Discharge status: Home / Self Care | Payer: BC Managed Care – PPO | Attending: Otolaryngology | Admitting: Otolaryngology

## 2021-08-15 ENCOUNTER — Ambulatory Visit: Payer: BC Managed Care – PPO | Admitting: Anesthesiology

## 2021-08-15 ENCOUNTER — Encounter: Payer: Self-pay | Admitting: Otolaryngology

## 2021-08-15 ENCOUNTER — Encounter
Admission: RE | Disposition: A | Discharge status: Home / Self Care | Payer: Self-pay | Source: Home / Self Care | Attending: Otolaryngology

## 2021-08-15 ENCOUNTER — Other Ambulatory Visit: Payer: Self-pay

## 2021-08-15 DIAGNOSIS — E669 Obesity, unspecified: Secondary | ICD-10-CM | POA: Insufficient documentation

## 2021-08-15 DIAGNOSIS — M1990 Unspecified osteoarthritis, unspecified site: Secondary | ICD-10-CM | POA: Insufficient documentation

## 2021-08-15 DIAGNOSIS — J342 Deviated nasal septum: Secondary | ICD-10-CM | POA: Diagnosis not present

## 2021-08-15 DIAGNOSIS — K219 Gastro-esophageal reflux disease without esophagitis: Secondary | ICD-10-CM | POA: Insufficient documentation

## 2021-08-15 DIAGNOSIS — Z6838 Body mass index (BMI) 38.0-38.9, adult: Secondary | ICD-10-CM | POA: Diagnosis not present

## 2021-08-15 DIAGNOSIS — J343 Hypertrophy of nasal turbinates: Secondary | ICD-10-CM | POA: Diagnosis not present

## 2021-08-15 DIAGNOSIS — J3489 Other specified disorders of nose and nasal sinuses: Secondary | ICD-10-CM | POA: Diagnosis not present

## 2021-08-15 HISTORY — DX: Gastro-esophageal reflux disease without esophagitis: K21.9

## 2021-08-15 HISTORY — DX: Other specified postprocedural states: Z98.890

## 2021-08-15 HISTORY — DX: Nausea with vomiting, unspecified: R11.2

## 2021-08-15 HISTORY — PX: NASAL TURBINATE REDUCTION: SHX2072

## 2021-08-15 HISTORY — PX: NASAL SEPTOPLASTY W/ TURBINOPLASTY: SHX2070

## 2021-08-15 HISTORY — DX: Unspecified osteoarthritis, unspecified site: M19.90

## 2021-08-15 HISTORY — DX: Cardiac murmur, unspecified: R01.1

## 2021-08-15 HISTORY — DX: Other specified postprocedural states: R11.2

## 2021-08-15 LAB — POCT PREGNANCY, URINE: Preg Test, Ur: NEGATIVE

## 2021-08-15 SURGERY — SEPTOPLASTY, NOSE, WITH NASAL TURBINATE REDUCTION
Anesthesia: General | Site: Nose | Laterality: Right

## 2021-08-15 MED ORDER — ROCURONIUM BROMIDE 100 MG/10ML IV SOLN
INTRAVENOUS | Status: DC | PRN
Start: 2021-08-15 — End: 2021-08-15
  Administered 2021-08-15: 5 mg via INTRAVENOUS

## 2021-08-15 MED ORDER — LIDOCAINE-EPINEPHRINE 1 %-1:100000 IJ SOLN
INTRAMUSCULAR | Status: DC | PRN
  Administered 2021-08-15: 6 mL

## 2021-08-15 MED ORDER — SUGAMMADEX SODIUM 200 MG/2ML IV SOLN
INTRAVENOUS | Status: DC | PRN
  Administered 2021-08-15: 200 mg via INTRAVENOUS

## 2021-08-15 MED ORDER — SCOPOLAMINE 1 MG/3DAYS TD PT72
1.0000 | MEDICATED_PATCH | Freq: Once | TRANSDERMAL | Status: DC
  Administered 2021-08-15: 1.5 mg via TRANSDERMAL

## 2021-08-15 MED ORDER — OXYCODONE HCL 5 MG/5ML PO SOLN: 5.0000 mg | Freq: Once | ORAL | Status: AC | PRN

## 2021-08-15 MED ORDER — FENTANYL CITRATE PF 50 MCG/ML IJ SOSY: 25.0000 ug | PREFILLED_SYRINGE | INTRAMUSCULAR | Status: DC | PRN

## 2021-08-15 MED ORDER — DEXAMETHASONE SODIUM PHOSPHATE 4 MG/ML IJ SOLN
INTRAMUSCULAR | Status: DC | PRN
  Administered 2021-08-15: 6 mg via INTRAVENOUS

## 2021-08-15 MED ORDER — ONDANSETRON HCL 4 MG/2ML IJ SOLN
INTRAMUSCULAR | Status: DC | PRN
  Administered 2021-08-15: 4 mg via INTRAVENOUS

## 2021-08-15 MED ORDER — ACETAMINOPHEN 10 MG/ML IV SOLN
1000.0000 mg | Freq: Once | INTRAVENOUS | Status: AC
  Administered 2021-08-15: 1000 mg via INTRAVENOUS

## 2021-08-15 MED ORDER — OXYMETAZOLINE HCL 0.05 % NA SOLN
2.0000 | Freq: Once | NASAL | Status: AC
  Administered 2021-08-15: 2 via NASAL

## 2021-08-15 MED ORDER — PHENYLEPHRINE HCL 0.5 % NA SOLN: NASAL | Status: DC | PRN

## 2021-08-15 MED ORDER — MIDAZOLAM HCL 5 MG/5ML IJ SOLN
INTRAMUSCULAR | Status: DC | PRN
  Administered 2021-08-15: 2 mg via INTRAVENOUS

## 2021-08-15 MED ORDER — LACTATED RINGERS IV SOLN: INTRAVENOUS | Status: DC

## 2021-08-15 MED ORDER — OXYCODONE HCL 5 MG PO TABS
5.0000 mg | ORAL_TABLET | Freq: Once | ORAL | Status: AC | PRN
  Administered 2021-08-15: 5 mg via ORAL

## 2021-08-15 MED ORDER — PREDNISONE 10 MG PO TABS: ORAL_TABLET | ORAL | 0 refills | Status: DC

## 2021-08-15 MED ORDER — HYDROCODONE-ACETAMINOPHEN 5-325 MG PO TABS: 1.0000 | ORAL_TABLET | ORAL | 0 refills | Status: DC | PRN

## 2021-08-15 MED ORDER — PROPOFOL 10 MG/ML IV BOLUS
INTRAVENOUS | Status: DC | PRN
  Administered 2021-08-15: 200 mg via INTRAVENOUS

## 2021-08-15 MED ORDER — DEXTROSE 5 % IV SOLN: 2000.0000 mg | Freq: Once | INTRAVENOUS | Status: DC

## 2021-08-15 MED ORDER — SUCCINYLCHOLINE CHLORIDE 200 MG/10ML IV SOSY
PREFILLED_SYRINGE | INTRAVENOUS | Status: DC | PRN
  Administered 2021-08-15: 120 mg via INTRAVENOUS

## 2021-08-15 MED ORDER — LIDOCAINE HCL (CARDIAC) PF 100 MG/5ML IV SOSY
PREFILLED_SYRINGE | INTRAVENOUS | Status: DC | PRN
Start: 2021-08-15 — End: 2021-08-15
  Administered 2021-08-15: 60 mg via INTRAVENOUS

## 2021-08-15 MED ORDER — CEPHALEXIN 500 MG PO CAPS: 500.0000 mg | ORAL_CAPSULE | Freq: Two times a day (BID) | ORAL | 0 refills | Status: DC

## 2021-08-15 MED ORDER — FENTANYL CITRATE (PF) 100 MCG/2ML IJ SOLN
INTRAMUSCULAR | Status: DC | PRN
  Administered 2021-08-15: 50 ug via INTRAVENOUS
  Administered 2021-08-15: 100 ug via INTRAVENOUS

## 2021-08-15 SURGICAL SUPPLY — 31 items
CANISTER SUCT 1200ML W/VALVE (MISCELLANEOUS) ×3 IMPLANT
CATH IV 18X1 1/4 SAFELET (CATHETERS) ×1 IMPLANT
COAGULATOR SUCT 8FR VV (MISCELLANEOUS) ×3 IMPLANT
ELECT REM PT RETURN 9FT ADLT (ELECTROSURGICAL) ×3
ELECTRODE REM PT RTRN 9FT ADLT (ELECTROSURGICAL) ×2 IMPLANT
GLOVE SURG GAMMEX PI TX LF 7.5 (GLOVE) ×4 IMPLANT
GOWN STRL REUS W/ TWL LRG LVL3 (GOWN DISPOSABLE) ×2 IMPLANT
GOWN STRL REUS W/TWL LRG LVL3 (GOWN DISPOSABLE) ×3
IV CATH 18X1 1/4 SAFELET (CATHETERS) ×2
KIT TURNOVER KIT A (KITS) ×3 IMPLANT
NDL ANESTHESIA 27G X 3.5 (NEEDLE) ×2 IMPLANT
NDL HYPO 27GX1-1/4 (NEEDLE) ×2 IMPLANT
NEEDLE ANESTHESIA  27G X 3.5 (NEEDLE)
NEEDLE ANESTHESIA 27G X 3.5 (NEEDLE) IMPLANT
NEEDLE HYPO 27GX1-1/4 (NEEDLE) ×3 IMPLANT
NS IRRIG 500ML POUR BTL (IV SOLUTION) ×2 IMPLANT
PACK ENT CUSTOM (PACKS) ×3 IMPLANT
PACKING NASAL EPIS 4X2.4 XEROG (MISCELLANEOUS) ×2 IMPLANT
PATTIES SURGICAL .5 X3 (DISPOSABLE) ×3 IMPLANT
SOL ANTI-FOG 6CC FOG-OUT (MISCELLANEOUS) ×2 IMPLANT
SOL FOG-OUT ANTI-FOG 6CC (MISCELLANEOUS) ×1
SPLINT NASAL SEPTAL BLV .50 ST (MISCELLANEOUS) ×3 IMPLANT
STRAP BODY AND KNEE 60X3 (MISCELLANEOUS) ×3 IMPLANT
SUT CHROMIC 3-0 (SUTURE) ×3
SUT CHROMIC 3-0 KS 27XMFL CR (SUTURE) ×2
SUT ETHILON 3-0 KS 30 BLK (SUTURE) ×3 IMPLANT
SUT PLAIN GUT 4-0 (SUTURE) ×3 IMPLANT
SUTURE CHRMC 3-0 KS 27XMFL CR (SUTURE) ×2 IMPLANT
SYR 3ML LL SCALE MARK (SYRINGE) ×3 IMPLANT
TOWEL OR 17X26 4PK STRL BLUE (TOWEL DISPOSABLE) ×4 IMPLANT
WATER STERILE IRR 250ML POUR (IV SOLUTION) ×3 IMPLANT

## 2021-08-15 NOTE — Op Note (Signed)
08/15/2021  1:32 PM  [id number]   Pre-Op Dx:  Deviated Nasal Septum, Hypertrophic Inferior Turbinates, hypertrophied middle turbinates  Post-op Dx: Same  Proc: Nasal Septoplasty, Bilateral Partial Reduction Inferior Turbinates, endoscopic trimming of the middle turbinates bilaterally  Surg:  Sarah Lowe  Anes:  GOT  EBL: 75 mL  Comp: None  Findings: The septum was very deviated to her left side where the right middle and inferior turbinates were way overgrown.  The left middle turbinate was lateralized and very thin and buckled over on itself inferiorly.  Procedure: With the patient in a comfortable supine position,  general orotracheal anesthesia was induced without difficulty.     The patient received preoperative Afrin spray for topical decongestion and vasoconstriction.  Intravenous prophylactic antibiotics were administered.  At an appropriate level, the patient was placed in a semi-sitting position.  Nasal vibrissae were trimmed.   1% Xylocaine with 1:100,000 epinephrine, 6 cc's, was infiltrated into the anterior floor of the nose, into the nasal spine region, into the membranous columella, and finally into the submucoperichondrial plane of the septum on both sides.  Several minutes were allowed for this to take effect.  Cottoniod pledgetts soaked in Afrin and 4% Xylocaine were placed into both nasal cavities and left while the patient was prepped and draped in the standard fashion.  The materials were removed from the nose and observed to be intact and correct in number.  The nose was inspected with a headlight and zero degree scope with the findings as described above.  A left Killian incision was sharply executed and carried down to the quadrangular cartilage. The mucoperichondrium was elelvated along the quadrangular plate back to the bony-cartilaginous junction. The mucoperiostium was then elevated along the ethmoid plate and the vomer. The boney-catilaginous junction was  then split with a freer elevator and the mucoperiosteum was elevated on the opposite side. The mucoperiosteum was then elevated along the maxillary crest as needed to expose the crooked bone of the crest.  Boney spurs of the vomer and maxillary crest were removed with Lenoria Chime forceps.  The cartilaginous plate was trimmed along its posterior and inferior borders of about 2 mm of cartilage to free it up inferiorly. Some of the deviated ethmoid plate was then fractured and removed with Takahashi forceps to free up the posterior border of the quadrangular plate and allow it to swing back to the midline. The mucosal flaps were placed back into their anatomic position to allow visualization of the airways. The septum now sat in the midline with an improved airway.  A 3-0 Chromic suture on a Keith needle in used to anchor the inferior septum at the nasal spine with a through and through suture. The mucosal flaps are then sutured together using a through and through whip stitch of 4-0 Plain Gut with a mini-Keith needle. This was used to close the [place] incision as well.   The inferior turbinates were then inspected. An incision was created along the inferior aspect of the left inferior turbinate with removal of some of the inferior soft tissue and bone. Electrocautery was used to control bleeding in the area. The remaining turbinate was then outfractured to open up the airway further. There was no significant bleeding noted. The right turbinate was then trimmed and outfractured in a similar fashion. The 0 degree scope was used to visualize the middle turbinate.  On the left side the middle turbinate was lateralized.  It had a paradoxical curve inferiorly that bent back towards  the midline.  This lower portion of the middle turbinate was trimmed off to eliminate the paradoxical curves with sat in a normal curvature.  This was trimmed with Gruenwald forceps and small through biting forceps.  Electrocautery was used  along its edge.  Xerogel was then placed in the middle meatus and over the trimmed edge of the middle turbinate.  On the right side the turbinate was very large with enlarged bone as well.  The turbinate was trimmed of its large lateral portion that was filling the middle meatus.  This required trimming some of the bony meatus as well as the soft tissue.  Electrocautery was used along its trimmed edge.  Xerogel was then placed in the middle meatus and along the surface of the middle turbinate.  The airways were then visualized and showed open passageways on both sides that were significantly improved compared to before surgery. There was no signifcant bleeding. Nasal splints were applied to both sides of the septum using Xomed 0.12mm regular sized splints that were trimmed, and then held in position with a 3-0 Nylon through and through suture.  The patient was turned back over to anesthesia, and awakened, extubated, and taken to the PACU in satisfactory condition.  Dispo:   PACU to home  Plan: Ice, elevation, narcotic analgesia, steroid taper, and prophylactic antibiotics for the duration of indwelling nasal foreign bodies.  We will reevaluate the patient in the office in 6 days and remove the septal splints.  Return to work in 10 days, strenuous activities in two weeks.   Sarah Lowe 08/15/2021 1:32 PM

## 2021-08-15 NOTE — Anesthesia Postprocedure Evaluation (Signed)
Anesthesia Post Note  Patient: Sarah Lowe  Procedure(s) Performed: NASAL SEPTOPLASTY WITH INFERIOR TURBINATE REDUCTION (Bilateral: Nose) MIDDLE TURBINATE REDUCTION (Right: Nose)     Patient location during evaluation: PACU Anesthesia Type: General Level of consciousness: awake Pain management: pain level controlled Vital Signs Assessment: post-procedure vital signs reviewed and stable Respiratory status: respiratory function stable Cardiovascular status: stable Postop Assessment: no signs of nausea or vomiting Anesthetic complications: no   No notable events documented.  Veda Canning

## 2021-08-15 NOTE — Anesthesia Preprocedure Evaluation (Signed)
Anesthesia Evaluation  Patient identified by MRN, date of birth, ID band Patient awake    Reviewed: Allergy & Precautions, NPO status   History of Anesthesia Complications (+) PONV and history of anesthetic complications  Airway Mallampati: II  TM Distance: >3 FB     Dental   Pulmonary    Pulmonary exam normal        Cardiovascular Exercise Tolerance: Good negative cardio ROS   Rhythm:Regular Rate:Normal     Neuro/Psych    GI/Hepatic GERD  ,  Endo/Other  Obesity - BMI 38  Renal/GU      Musculoskeletal  (+) Arthritis ,   Abdominal   Peds  Hematology   Anesthesia Other Findings   Reproductive/Obstetrics                             Anesthesia Physical Anesthesia Plan  ASA: 2  Anesthesia Plan: General   Post-op Pain Management: Ofirmev IV (intra-op)   Induction: Intravenous  PONV Risk Score and Plan: 4 or greater and Ondansetron, Dexamethasone, Midazolam, Treatment may vary due to age or medical condition and Scopolamine patch - Pre-op  Airway Management Planned: Oral ETT  Additional Equipment:   Intra-op Plan:   Post-operative Plan:   Informed Consent: I have reviewed the patients History and Physical, chart, labs and discussed the procedure including the risks, benefits and alternatives for the proposed anesthesia with the patient or authorized representative who has indicated his/her understanding and acceptance.     Dental advisory given  Plan Discussed with: CRNA  Anesthesia Plan Comments:         Anesthesia Quick Evaluation

## 2021-08-15 NOTE — Anesthesia Procedure Notes (Signed)
Procedure Name: Intubation Date/Time: 08/15/2021 12:29 PM Performed by: Dionne Bucy, CRNA Pre-anesthesia Checklist: Patient identified, Patient being monitored, Timeout performed, Emergency Drugs available and Suction available Patient Re-evaluated:Patient Re-evaluated prior to induction Oxygen Delivery Method: Circle system utilized Preoxygenation: Pre-oxygenation with 100% oxygen Induction Type: IV induction Ventilation: Mask ventilation without difficulty Laryngoscope Size: Mac and 3 Grade View: Grade II Tube type: Oral Rae Tube size: 7.0 mm Number of attempts: 1 Airway Equipment and Method: Stylet Placement Confirmation: ETT inserted through vocal cords under direct vision, positive ETCO2 and breath sounds checked- equal and bilateral Tube secured with: Tape Dental Injury: Teeth and Oropharynx as per pre-operative assessment

## 2021-08-15 NOTE — H&P (Signed)
H&P has been reviewed and patient reevaluated, no changes necessary. To be downloaded later.  

## 2021-08-15 NOTE — Transfer of Care (Signed)
Immediate Anesthesia Transfer of Care Note  Patient: Sarah Lowe  Procedure(s) Performed: NASAL SEPTOPLASTY WITH INFERIOR TURBINATE REDUCTION (Bilateral: Nose) MIDDLE TURBINATE REDUCTION (Right: Nose)  Patient Location: PACU  Anesthesia Type: General  Level of Consciousness: awake, alert  and patient cooperative  Airway and Oxygen Therapy: Patient Spontanous Breathing and Patient connected to supplemental oxygen  Post-op Assessment: Post-op Vital signs reviewed, Patient's Cardiovascular Status Stable, Respiratory Function Stable, Patent Airway and No signs of Nausea or vomiting  Post-op Vital Signs: Reviewed and stable  Complications: No notable events documented.

## 2021-08-16 ENCOUNTER — Encounter: Payer: Self-pay | Admitting: Otolaryngology

## 2021-08-21 DIAGNOSIS — J3489 Other specified disorders of nose and nasal sinuses: Secondary | ICD-10-CM | POA: Diagnosis not present

## 2021-08-22 ENCOUNTER — Ambulatory Visit: Payer: BC Managed Care – PPO | Admitting: Cardiology

## 2021-08-22 ENCOUNTER — Encounter: Payer: Self-pay | Admitting: Cardiology

## 2021-08-22 VITALS — BP 110/76 | HR 81 | Ht 68.0 in | Wt 244.0 lb

## 2021-08-22 DIAGNOSIS — I38 Endocarditis, valve unspecified: Secondary | ICD-10-CM

## 2021-08-22 DIAGNOSIS — R002 Palpitations: Secondary | ICD-10-CM | POA: Diagnosis not present

## 2021-08-22 DIAGNOSIS — E7800 Pure hypercholesterolemia, unspecified: Secondary | ICD-10-CM | POA: Diagnosis not present

## 2021-08-22 DIAGNOSIS — Z6837 Body mass index (BMI) 37.0-37.9, adult: Secondary | ICD-10-CM

## 2021-08-22 NOTE — Patient Instructions (Signed)
Medication Instructions:  ? ?Your physician recommends that you continue on your current medications as directed. Please refer to the Current Medication list given to you today. ? ?*If you need a refill on your cardiac medications before your next appointment, please call your pharmacy* ? ? ?Lab Work: ? ?None ordered ? ?If you have labs (blood work) drawn today and your tests are completely normal, you will receive your results only by: ?MyChart Message (if you have MyChart) OR ?A paper copy in the mail ?If you have any lab test that is abnormal or we need to change your treatment, we will call you to review the results. ? ? ?Testing/Procedures: ? ?None ordered ? ? ?Follow-Up: ?At [facility], you and your health needs are our priority.  As part of our continuing mission to provide you with exceptional heart care, we have created designated Provider Care Teams.  These Care Teams include your primary Cardiologist (physician) and Advanced Practice Providers (APPs -  Physician Assistants and Nurse Practitioners) who all work together to provide you with the care you need, when you need it. ? ?We recommend signing up for the patient portal called "MyChart".  Sign up information is provided on this After Visit Summary.  MyChart is used to connect with patients for Virtual Visits (Telemedicine).  Patients are able to view lab/test results, encounter notes, upcoming appointments, etc.  Non-urgent messages can be sent to your provider as well.   ?To learn more about what you can do with MyChart, go to [email or web address].   ? ?Your next appointment:   ? ?Follow up as needed  ? ?The format for your next appointment:   ?In Person ? ?Provider:   ?You may see Brian Agbor-Etang, MD or one of the following Advanced Practice Providers on your designated Care Team:   ?Christopher Berge, NP ?Ryan Dunn, PA-C ?Cadence Furth, PA-C  ? ? ?Other Instructions ? ? ?Important Information About Sugar ? ? ? ? ? ? ?

## 2021-08-22 NOTE — Progress Notes (Signed)
Cardiology Office Note:    Date:  08/22/2021   ID:  Sarah Lowe, DOB 1981/01/16, MRN [id number]  PCP:  Sarah Culver, MD   [facility] Providers Cardiologist:  Kate Sable, MD     Referring MD: Sarah Culver, MD   No chief complaint on file.   History of Present Illness:    Sarah Lowe is a 41 y.o. female with a hx of hyperlipidemia, obesity who presents for follow-up.  Previously seen due to palpitations and elevated heart rates ongoing for 3 months.   Cardiac monitor was placed to evaluate any significant arrhythmias.  Patient also states having history of valvular regurgitation, echocardiogram obtained to evaluate any significant valvular abnormalities.  She states doing okay, recently had nasal septal surgery.  Presents for testing results.  Prior notes  she was previously evaluated for palpitations in 2015 in [place].  Cardiac monitor and echo were unremarkable, valvular insufficiency noted.  She used to be very active prior to having bilateral patellar tendon rupture in 2017.   Past Medical History:  Diagnosis Date   Allergy    Arthritis    left knee   GERD (gastroesophageal reflux disease)    Heart murmur    PONV (postoperative nausea and vomiting)     Past Surgical History:  Procedure Laterality Date   FINGER MASS EXCISION Right 2021   fourth finger   GUM SURGERY  2015   NASAL SEPTOPLASTY W/ TURBINOPLASTY Bilateral 08/15/2021   Procedure: NASAL SEPTOPLASTY WITH INFERIOR TURBINATE REDUCTION;  Surgeon: Margaretha Sheffield, MD;  Location: [facility];  Service: ENT;  Laterality: Bilateral;   NASAL TURBINATE REDUCTION Right 08/15/2021   Procedure: MIDDLE TURBINATE REDUCTION;  Surgeon: Margaretha Sheffield, MD;  Location: [facility];  Service: ENT;  Laterality: Right;   PATELLAR TENDON REPAIR Bilateral 2017   WISDOM TOOTH EXTRACTION Bilateral 2001    Current Medications: Current Meds  Medication Sig   Beclomethasone  Dipropionate (QNASL) 80 MCG/ACT AERS Place 2 sprays into both nostrils daily.   cephALEXin (KEFLEX) 500 MG capsule Take 1 capsule (500 mg total) by mouth 2 (two) times daily.   Digestive Enzymes (DIGESTIVE ENZYME PO) Take by mouth daily.   EPINEPHrine 0.3 mg/0.3 mL IJ SOAJ injection Inject 0.3 mg into the muscle once as needed.   Probiotic Product (PROBIOTIC PO) Take by mouth daily.     Allergies:   Patient has no known allergies.   Social History   Socioeconomic History   Marital status: Married    Spouse name: Marya Fossa   Number of children: 0   Years of education: 16   Highest education level: Bachelor's degree (e.g., BA, AB, BS)  Occupational History   Not on file  Tobacco Use   Smoking status: Never   Smokeless tobacco: Never  Vaping Use   Vaping Use: Never used  Substance and Sexual Activity   Alcohol use: Yes    Alcohol/week: 3.0 standard drinks    Types: 3 Standard drinks or equivalent per week   Drug use: Never   Sexual activity: Yes    Partners: Male    Birth control/protection: None  Other Topics Concern   Not on file  Social History Narrative   Not on file   Social Determinants of Health   Financial Resource Strain: Not on file  Food Insecurity: Not on file  Transportation Needs: Not on file  Physical Activity: Not on file  Stress: Not on file  Social Connections: Not on file  Family History: The patient's family history includes Anemia in her mother; Anuerysm in her maternal grandfather and paternal grandfather; Cancer in her mother; Colon cancer in her maternal grandmother; Heart attack in her paternal grandmother; Hypertension in her father; Other in her father; Stroke in her paternal grandmother.  ROS:   Please see the history of present illness.     All other systems reviewed and are negative.  EKGs/Labs/Other Studies Reviewed:    The following studies were reviewed today:   EKG:  EKG is  ordered today.  The ekg ordered today  demonstrates normal sinus rhythm, normal ECG  Recent Labs: 01/31/2021: ALT 17; BUN 11; Creatinine, Ser 0.92; Hemoglobin 12.5; Platelets 245; Potassium 4.4; Sodium 137; TSH 1.220  Recent Lipid Panel    Component Value Date/Time   CHOL 201 (H) 07/29/2021 1027   CHOL 218 (H) 01/31/2021 1057   TRIG 70 07/29/2021 1027   HDL 46 07/29/2021 1027   HDL 50 01/31/2021 1057   CHOLHDL 4.4 07/29/2021 1027   VLDL 14 07/29/2021 1027   LDLCALC 141 (H) 07/29/2021 1027   LDLCALC 156 (H) 01/31/2021 1057     Risk Assessment/Calculations:          Physical Exam:    VS:  BP 110/76   Pulse 81   Ht 5\' 8"  (1.727 m)   Wt 244 lb (110.7 kg)   LMP 08/08/2021 (Exact Date) Comment: preg test neg  SpO2 98%   BMI 37.10 kg/m     Wt Readings from Last 3 Encounters:  08/22/21 244 lb (110.7 kg)  08/15/21 244 lb 11.2 oz (111 kg)  07/21/21 250 lb (113.4 kg)     GEN:  Well nourished, well developed in no acute distress HEENT: Nasal dressing ordered NECK: No JVD; No carotid bruits CARDIAC: RRR, faint systolic murmur left sternal border RESPIRATORY:  Clear to auscultation without rales, wheezing or rhonchi  ABDOMEN: Soft, non-tender, non-distended MUSCULOSKELETAL:  No edema; No deformity  SKIN: Warm and dry NEUROLOGIC:  Alert and oriented x 3 PSYCHIATRIC:  Normal affect   ASSESSMENT:    1. Palpitation   2. Heart valve insufficiency   3. Pure hypercholesterolemia   4. BMI 37.0-37.9, adult     PLAN:    In order of problems listed above:  Palpitations, cardiac monitor showed 2 paroxysmal SVTs.  Average heart rate 73.  No significant arrhythmias noted.  Reported history of valvular regurg, echo 07/2021 with normal systolic and diastolic function, EF 60 to 65%, trivial TR.  No significant valvular abnormalities noted, patient made aware of results, reassured. Hyperlipidemia, patient not in statin benefit group.  Low-cholesterol diet, weight loss advised. Obesity, low-calorie diet, exercise and  weight loss advised.   Follow-up as needed.  FOCUS should be on weight loss and conditioning.      Medication Adjustments/Labs and Tests Ordered: Current medicines are reviewed at length with the patient today.  Concerns regarding medicines are outlined above.  Orders Placed This Encounter  Procedures   EKG 12-Lead   No orders of the defined types were placed in this encounter.   Patient Instructions  Medication Instructions:   Your physician recommends that you continue on your current medications as directed. Please refer to the Current Medication list given to you today.  *If you need a refill on your cardiac medications before your next appointment, please call your pharmacy*   Lab Work: None ordered  If you have labs (blood work) drawn today and your tests are completely normal, you  will receive your results only by: [place] (if you have MyChart) OR A paper copy in the mail If you have any lab test that is abnormal or we need to change your treatment, we will call you to review the results.   Testing/Procedures: None ordered    Follow-Up: At [facility], you and your health needs are our priority.  As part of our continuing mission to provide you with exceptional heart care, we have created designated Provider Care Teams.  These Care Teams include your primary Cardiologist (physician) and Advanced Practice Providers (APPs -  Physician Assistants and Nurse Practitioners) who all work together to provide you with the care you need, when you need it.  We recommend signing up for the patient portal called "MyChart".  Sign up information is provided on this After Visit Summary.  MyChart is used to connect with patients for Virtual Visits (Telemedicine).  Patients are able to view lab/test results, encounter notes, upcoming appointments, etc.  Non-urgent messages can be sent to your provider as well.   To learn more about what you can do with MyChart, go to  [email or web address].    Your next appointment:   Follow up as needed   The format for your next appointment:   In Person  Provider:   You may see Kate Sable, MD or one of the following Advanced Practice Providers on your designated Care Team:   Murray Hodgkins, NP Christell Faith, PA-C Cadence Kathlen Mody, [place]    Other Instructions   Important Information About Sugar         Signed, Kate Sable, MD  08/22/2021 10:50 AM    [place]

## 2021-08-28 DIAGNOSIS — J3489 Other specified disorders of nose and nasal sinuses: Secondary | ICD-10-CM | POA: Diagnosis not present

## 2021-09-03 DIAGNOSIS — J3489 Other specified disorders of nose and nasal sinuses: Secondary | ICD-10-CM | POA: Diagnosis not present

## 2021-09-11 DIAGNOSIS — J3489 Other specified disorders of nose and nasal sinuses: Secondary | ICD-10-CM | POA: Diagnosis not present

## 2021-09-19 DIAGNOSIS — J3489 Other specified disorders of nose and nasal sinuses: Secondary | ICD-10-CM | POA: Diagnosis not present

## 2021-09-23 DIAGNOSIS — F902 Attention-deficit hyperactivity disorder, combined type: Secondary | ICD-10-CM | POA: Diagnosis not present

## 2021-10-07 DIAGNOSIS — J3489 Other specified disorders of nose and nasal sinuses: Secondary | ICD-10-CM | POA: Diagnosis not present

## 2021-10-07 DIAGNOSIS — J301 Allergic rhinitis due to pollen: Secondary | ICD-10-CM | POA: Diagnosis not present

## 2021-10-21 DIAGNOSIS — F902 Attention-deficit hyperactivity disorder, combined type: Secondary | ICD-10-CM | POA: Diagnosis not present

## 2021-11-18 DIAGNOSIS — F902 Attention-deficit hyperactivity disorder, combined type: Secondary | ICD-10-CM | POA: Diagnosis not present

## 2021-12-11 ENCOUNTER — Other Ambulatory Visit: Payer: Self-pay

## 2021-12-11 DIAGNOSIS — J309 Allergic rhinitis, unspecified: Secondary | ICD-10-CM

## 2021-12-11 MED ORDER — QNASL 80 MCG/ACT NA AERS: 2.0000 | INHALATION_SPRAY | Freq: Every day | NASAL | 0 refills | Status: DC

## 2022-02-04 ENCOUNTER — Ambulatory Visit: Payer: BC Managed Care – PPO | Admitting: Family Medicine

## 2022-02-10 ENCOUNTER — Ambulatory Visit (INDEPENDENT_AMBULATORY_CARE_PROVIDER_SITE_OTHER): Payer: BC Managed Care – PPO | Admitting: Family Medicine

## 2022-02-10 ENCOUNTER — Telehealth: Payer: Self-pay | Admitting: Family Medicine

## 2022-02-10 ENCOUNTER — Encounter: Payer: Self-pay | Admitting: Family Medicine

## 2022-02-10 VITALS — BP 104/70 | HR 64 | Ht 68.0 in | Wt 244.0 lb

## 2022-02-10 DIAGNOSIS — R7989 Other specified abnormal findings of blood chemistry: Secondary | ICD-10-CM

## 2022-02-10 DIAGNOSIS — Z23 Encounter for immunization: Secondary | ICD-10-CM | POA: Insufficient documentation

## 2022-02-10 DIAGNOSIS — Z13220 Encounter for screening for lipoid disorders: Secondary | ICD-10-CM | POA: Diagnosis not present

## 2022-02-10 DIAGNOSIS — Z0000 Encounter for general adult medical examination without abnormal findings: Secondary | ICD-10-CM

## 2022-02-10 DIAGNOSIS — Z87898 Personal history of other specified conditions: Secondary | ICD-10-CM | POA: Diagnosis not present

## 2022-02-10 DIAGNOSIS — Z1321 Encounter for screening for nutritional disorder: Secondary | ICD-10-CM | POA: Diagnosis not present

## 2022-02-10 DIAGNOSIS — M222X2 Patellofemoral disorders, left knee: Secondary | ICD-10-CM

## 2022-02-10 DIAGNOSIS — Z1329 Encounter for screening for other suspected endocrine disorder: Secondary | ICD-10-CM | POA: Diagnosis not present

## 2022-02-10 NOTE — Assessment & Plan Note (Signed)
Chronic condition most recently noted after ENT surgery.  She is a, non-smoker, nondiabetic without vascular disease or known immunosuppressive therapy.  Nutritional component considered, baseline labs ordered, MVI encouraged.

## 2022-02-10 NOTE — Patient Instructions (Addendum)
-   Obtain fasting labs with orders provided (can have water or black coffee but otherwise no food or drink x 8 hours before labs) - Review information provided - Start multivitamin daily - We will reach out for viscosupplementation (gel injections) - Attend eye doctor annually, dentist every 6 months, work towards or maintain 30 minutes of moderate intensity physical activity at least 5 days per week, and consume a balanced diet - Return in 1 year for physical - Contact us for any questions between now and then

## 2022-02-10 NOTE — Assessment & Plan Note (Signed)
Annual influenza vaccination administered today. 

## 2022-02-10 NOTE — Assessment & Plan Note (Signed)
Chronic condition, did follow up with [facility], was offered surgery, did perform PRP with improvement.  We will seek out viscosupplementation and patient can continue with PRP treatments

## 2022-02-10 NOTE — Assessment & Plan Note (Signed)
Annual examination completed, risk stratification labs ordered, anticipatory guidance provided.  We will follow labs once resulted. 

## 2022-02-10 NOTE — Telephone Encounter (Signed)
PA started

## 2022-02-10 NOTE — Progress Notes (Signed)
Annual Physical Exam Visit  Patient Information:  Patient ID: Sarah Lowe, female DOB: [birth year] Age: 41 y.o. MRN: [id number]   Subjective:   CC: Annual Physical Exam  HPI:  Sarah Lowe is here for their annual physical.  I reviewed the past medical history, family history, social history, surgical history, and allergies today and changes were made as necessary.  Please see the problem list section below for additional details.  Past Medical History: Past Medical History:  Diagnosis Date   Allergy    Arthritis    left knee   GERD (gastroesophageal reflux disease)    Heart murmur    PONV (postoperative nausea and vomiting)    Past Surgical History: Past Surgical History:  Procedure Laterality Date   FINGER MASS EXCISION Right 2021   fourth finger   GUM SURGERY  2015   NASAL SEPTOPLASTY W/ TURBINOPLASTY Bilateral 08/15/2021   Procedure: NASAL SEPTOPLASTY WITH INFERIOR TURBINATE REDUCTION;  Surgeon: Vernie Murders, MD;  [facility];  Service: ENT;  Laterality: Bilateral;   NASAL TURBINATE REDUCTION Right 08/15/2021   Procedure: MIDDLE TURBINATE REDUCTION;  Surgeon: Vernie Murders, MD;  Location: [facility];  Service: ENT;  Laterality: Right;   PATELLAR TENDON REPAIR Bilateral 2017   WISDOM TOOTH EXTRACTION Bilateral 2001   Family History: Family History  Problem Relation Age of Onset   Cancer Mother        blood cancer   Anemia Mother    Hypertension Father    Other Father        Agent Orange   Colon cancer Maternal Grandmother    Anuerysm Maternal Grandfather    Heart attack Paternal Grandmother    Stroke Paternal Grandmother    Anuerysm Paternal Grandfather    Allergies: No Known Allergies Health Maintenance: Health Maintenance  Topic Date Due   COVID-19 Vaccine (5 - Pfizer series) 05/25/2020   PAP SMEAR-Modifier  01/17/2024   INFLUENZA VACCINE  Completed   Hepatitis C Screening  Completed   HIV Screening   Completed   HPV VACCINES  Aged Out    HM Colonoscopy     This patient has no relevant Health Maintenance data.      Medications: Current Outpatient Medications on File Prior to Visit  Medication Sig Dispense Refill   EPINEPHrine 0.3 mg/0.3 mL IJ SOAJ injection Inject 0.3 mg into the muscle once as needed.     Beclomethasone Dipropionate (QNASL) 80 MCG/ACT AERS Place 2 sprays into both nostrils daily. 1 each 0   No current facility-administered medications on file prior to visit.    Review of Systems: No headache, visual changes, nausea, vomiting, diarrhea, constipation, dizziness, abdominal pain, skin rash, fevers, chills, night sweats, swollen lymph nodes, weight loss, chest pain, body aches, joint swelling, muscle aches, shortness of breath, mood changes, visual or auditory hallucinations reported.  Objective:   Vitals:   02/10/22 0908  BP: 104/70  Pulse: 64  SpO2: 99%   Vitals:   02/10/22 0908  Weight: 244 lb (110.7 kg)  Height: 5\' 8"  (1.727 m)   Body mass index is 37.1 kg/m.  General: Well Developed, well nourished, and in no acute distress.  Neuro: Alert and oriented x3, extra-ocular muscles intact, sensation grossly intact. Cranial nerves II through XII are grossly intact, motor, sensory, and coordinative functions are intact. HEENT: Normocephalic, atraumatic, pupils equal round reactive to light, neck supple, no masses, no lymphadenopathy, thyroid nonpalpable. Oropharynx, nasopharynx, external ear canals are unremarkable. Skin:  Warm and dry, no rashes noted.  Cardiac: Regular rate and rhythm, no murmurs rubs or gallops. No peripheral edema. Pulses symmetric. Respiratory: Clear to auscultation bilaterally. Not using accessory muscles, speaking in full sentences.  Abdominal: Soft, nontender, nondistended, positive bowel sounds, no masses, no organomegaly. Musculoskeletal: Shoulder, elbow, wrist, hip, knee, ankle stable, and with full range of motion.  Female  chaperone initials: KG present throughout the physical examination.  Impression and Recommendations:   The patient was counselled, risk factors were discussed, and anticipatory guidance given.  Problem List Items Addressed This Visit       Musculoskeletal and Integument   Patellofemoral syndrome, left    Chronic condition, did follow up with [facility], was offered surgery, did perform PRP with improvement.  We will seek out viscosupplementation and patient can continue with PRP treatments        Other   Annual physical exam - Primary    Annual examination completed, risk stratification labs ordered, anticipatory guidance provided.  We will follow labs once resulted.      Relevant Orders   CBC   Comprehensive metabolic panel   Lipid panel   TSH   VITAMIN D 25 Hydroxy (Vit-D Deficiency, Fractures)   Need for immunization against influenza    Annual influenza vaccination administered today      Relevant Orders   Flu Vaccine QUAD 25mo+IM (Fluarix, Fluzone & Alfiuria Quad PF) (Completed)   History of delayed wound healing    Chronic condition most recently noted after ENT surgery.  She is a, non-smoker, nondiabetic without vascular disease or known immunosuppressive therapy.  Nutritional component considered, baseline labs ordered, MVI encouraged.      Other Visit Diagnoses     Low serum vitamin D       Relevant Orders   VITAMIN D 25 Hydroxy (Vit-D Deficiency, Fractures)   Screening for lipoid disorders       Relevant Orders   Comprehensive metabolic panel   Lipid panel        Orders & Medications Medications: No orders of the defined types were placed in this encounter.  Orders Placed This Encounter  Procedures   Flu Vaccine QUAD 1mo+IM (Fluarix, Fluzone & Alfiuria Quad PF)   CBC   Comprehensive metabolic panel   Lipid panel   TSH   VITAMIN D 25 Hydroxy (Vit-D Deficiency, Fractures)     Return in about 1 year (around 02/11/2023) for CPE.    Jerrol Banana, MD   Primary Care Sports Medicine [facility]

## 2022-02-11 ENCOUNTER — Encounter: Payer: Self-pay | Admitting: Family Medicine

## 2022-02-11 LAB — LIPID PANEL
Chol/HDL Ratio: 3.6 ratio (ref 0.0–4.4)
Cholesterol, Total: 221 mg/dL — ABNORMAL HIGH (ref 100–199)
HDL: 62 mg/dL (ref >39)
LDL Chol Calc (NIH): 147 mg/dL — ABNORMAL HIGH (ref 0–99)
Triglycerides: 71 mg/dL (ref 0–149)
VLDL Cholesterol Cal: 12 mg/dL (ref 5–40)

## 2022-02-11 LAB — CBC
Hematocrit: 39.1 % (ref 34.0–46.6)
Hemoglobin: 12.9 g/dL (ref 11.1–15.9)
MCH: 28.9 pg (ref 26.6–33.0)
MCHC: 33 g/dL (ref 31.5–35.7)
MCV: 88 fL (ref 79–97)
Platelets: 283 10*3/uL (ref 150–450)
RBC: 4.46 x10E6/uL (ref 3.77–5.28)
RDW: 13 % (ref 11.7–15.4)
WBC: 3.5 10*3/uL (ref 3.4–10.8)

## 2022-02-11 LAB — COMPREHENSIVE METABOLIC PANEL
ALT: 11 IU/L (ref 0–32)
AST: 16 IU/L (ref 0–40)
Albumin/Globulin Ratio: 1.3 (ref 1.2–2.2)
Albumin: 4.1 g/dL (ref 3.9–4.9)
Alkaline Phosphatase: 71 IU/L (ref 44–121)
BUN/Creatinine Ratio: 10 (ref 9–23)
BUN: 10 mg/dL (ref 6–24)
Bilirubin Total: <0.2 mg/dL (ref 0.0–1.2)
CO2: 24 mmol/L (ref 20–29)
Calcium: 9.5 mg/dL (ref 8.7–10.2)
Chloride: 97 mmol/L (ref 96–106)
Creatinine, Ser: 1 mg/dL (ref 0.57–1.00)
Globulin, Total: 3.1 g/dL (ref 1.5–4.5)
Glucose: 89 mg/dL (ref 70–99)
Potassium: 4.2 mmol/L (ref 3.5–5.2)
Sodium: 138 mmol/L (ref 134–144)
Total Protein: 7.2 g/dL (ref 6.0–8.5)
eGFR: 73 mL/min/{1.73_m2} (ref >59)

## 2022-02-11 LAB — TSH: TSH: 1.52 u[IU]/mL (ref 0.450–4.500)

## 2022-02-11 LAB — VITAMIN D 25 HYDROXY (VIT D DEFICIENCY, FRACTURES): Vit D, 25-Hydroxy: 22.3 ng/mL — ABNORMAL LOW (ref 30.0–100.0)

## 2022-02-11 NOTE — Telephone Encounter (Signed)
Please review.  KP

## 2022-02-24 ENCOUNTER — Other Ambulatory Visit: Payer: Self-pay | Admitting: Family Medicine

## 2022-02-24 DIAGNOSIS — R7989 Other specified abnormal findings of blood chemistry: Secondary | ICD-10-CM

## 2022-02-24 DIAGNOSIS — Z806 Family history of leukemia: Secondary | ICD-10-CM

## 2022-02-24 MED ORDER — VITAMIN D (ERGOCALCIFEROL) 1.25 MG (50000 UNIT) PO CAPS: 50000.0000 [IU] | ORAL_CAPSULE | ORAL | 0 refills | Status: DC

## 2022-02-27 DIAGNOSIS — Z806 Family history of leukemia: Secondary | ICD-10-CM | POA: Diagnosis not present

## 2022-03-03 LAB — PATHOLOGIST SMEAR REVIEW
Basophils Absolute: 0 10*3/uL (ref 0.0–0.2)
Basos: 1 %
EOS (ABSOLUTE): 0.1 10*3/uL (ref 0.0–0.4)
Eos: 2 %
Hematocrit: 35.2 % (ref 34.0–46.6)
Hemoglobin: 11.4 g/dL (ref 11.1–15.9)
Immature Grans (Abs): 0 10*3/uL (ref 0.0–0.1)
Immature Granulocytes: 0 %
Lymphocytes Absolute: 1.2 10*3/uL (ref 0.7–3.1)
Lymphs: 34 %
MCH: 28.4 pg (ref 26.6–33.0)
MCHC: 32.4 g/dL (ref 31.5–35.7)
MCV: 88 fL (ref 79–97)
Monocytes Absolute: 0.4 10*3/uL (ref 0.1–0.9)
Monocytes: 12 %
Neutrophils Absolute: 1.8 10*3/uL (ref 1.4–7.0)
Neutrophils: 51 %
Platelets: 262 10*3/uL (ref 150–450)
RBC: 4.02 x10E6/uL (ref 3.77–5.28)
RDW: 13.2 % (ref 11.7–15.4)
WBC: 3.4 10*3/uL (ref 3.4–10.8)

## 2022-05-13 ENCOUNTER — Telehealth: Payer: Self-pay

## 2022-05-13 NOTE — Transitions of Care (Post Inpatient/ED Visit) (Signed)
   05/13/2022  Name: Sarah Lowe MRN: [id number] DOB: 1980/07/16  Today's TOC FU Call Status:    Attempted to reach the patient regarding the most recent Inpatient/ED visit.  Follow Up Plan: Additional outreach attempts will be made to reach the patient to complete the Transitions of Care (Post Inpatient/ED visit) call.   Signature Marguarite Arbour CMA

## 2022-05-14 NOTE — Transitions of Care (Post Inpatient/ED Visit) (Signed)
   05/14/2022  Name: Sarah Lowe MRN: [id number] DOB: March 15, 1981  Today's TOC FU Call Status: Today's TOC FU Call Status:: Unsuccessful Call (2nd Attempt) Unsuccessful Call (1st Attempt) Date: 05/14/22  Attempted to reach the patient regarding the most recent Inpatient/ED visit.  Follow Up Plan: Additional outreach attempts will be made to reach the patient to complete the Transitions of Care (Post Inpatient/ED visit) call.   Signature Marguarite Arbour CMA

## 2022-05-16 NOTE — Transitions of Care (Post Inpatient/ED Visit) (Signed)
   05/16/2022  Name: Sarah Lowe MRN: [id number] DOB: 1980/08/24  Today's TOC FU Call Status: Today's TOC FU Call Status:: Unsuccessful Call (3rd Attempt) Unsuccessful Call (1st Attempt) Date: 05/14/22  Attempted to reach the patient regarding the most recent Inpatient/ED visit.  Follow Up Plan: No further outreach attempts will be made at this time. We have been unable to contact the patient.  Signature [employer]

## 2022-05-30 ENCOUNTER — Encounter: Payer: Self-pay | Admitting: Family Medicine

## 2022-05-30 ENCOUNTER — Ambulatory Visit (INDEPENDENT_AMBULATORY_CARE_PROVIDER_SITE_OTHER): Payer: BC Managed Care – PPO | Admitting: Family Medicine

## 2022-05-30 ENCOUNTER — Other Ambulatory Visit: Payer: Self-pay | Admitting: Family Medicine

## 2022-05-30 ENCOUNTER — Ambulatory Visit: Payer: Self-pay

## 2022-05-30 VITALS — BP 118/70 | HR 76 | Ht 68.0 in | Wt 247.0 lb

## 2022-05-30 DIAGNOSIS — Z7689 Persons encountering health services in other specified circumstances: Secondary | ICD-10-CM

## 2022-05-30 DIAGNOSIS — M222X2 Patellofemoral disorders, left knee: Secondary | ICD-10-CM

## 2022-05-30 MED ORDER — LIRAGLUTIDE 18 MG/3ML ~~LOC~~ SOPN: PEN_INJECTOR | SUBCUTANEOUS | 2 refills | Status: DC

## 2022-05-30 MED ORDER — TOPIRAMATE ER 25 MG PO CAP24: 1.0000 | ORAL_CAPSULE | Freq: Every day | ORAL | 0 refills | Status: DC

## 2022-05-30 MED ORDER — PHENTERMINE HCL 15 MG PO CAPS: 15.0000 mg | ORAL_CAPSULE | ORAL | 0 refills | Status: DC

## 2022-05-30 NOTE — Telephone Encounter (Signed)
Summary: Pharmacy seeking Rx clarification.   Joelene Millin from [facility] stated that she received Rx for liraglutide (VICTOZA) 18 MG/3ML SOPN for a target dose of 3 mg once daily. Joelene Millin stated max dose is 1.8 MG.   Pharmacy seeking clarification.     Please advise

## 2022-05-30 NOTE — Assessment & Plan Note (Signed)
Chronic despite work towards regular athletic activity and attempts at dietary modification. We reviewed adjunct pharmacologic management options, will trial liraglutide with weekly titration. If not covered can consider combination of phentermine and topirimate.

## 2022-05-30 NOTE — Progress Notes (Signed)
     Primary Care / Sports Medicine Office Visit  Patient Information:  Patient ID: Sarah Lowe, female DOB: Dec 26, 1980 Age: 42 y.o. MRN: [id number]   Sarah Lowe is a pleasant 42 y.o. female presenting with the following:  Chief Complaint  Patient presents with   Weight Management Screening    Vitals:   05/30/22 0812  BP: 118/70  Pulse: 76  SpO2: 99%   Vitals:   05/30/22 0812  Weight: 247 lb (112 kg)  Height: 5\' 8"  (1.727 m)   Body mass index is 37.56 kg/m.  No results found.   Independent interpretation of notes and tests performed by another provider:   None  Procedures performed:   None  Pertinent History, Exam, Impression, and Recommendations:   Sarah Lowe was seen today for weight management screening.  Encounter for weight management Assessment & Plan: Chronic despite work towards regular athletic activity and attempts at dietary modification. We reviewed adjunct pharmacologic management options, will trial liraglutide with weekly titration. If not covered can consider combination of phentermine and topirimate.    Patellofemoral syndrome, left Assessment & Plan: Chronic, ongoing symptomatology, had previously sought viscosupplementation, will follow-up on status (latest pending at specialty pharmacy)   Other orders -     Liraglutide; Inject 0.6 mg into the skin daily. 0.6 mg once daily for 1 week; increase by 0.6 mg daily at weekly intervals to a target dose of 1.8 mg once daily  Dispense: 3 mL; Refill: 2     Orders & Medications Meds ordered this encounter  Medications   DISCONTD: liraglutide (VICTOZA) 18 MG/3ML SOPN    Sig: Inject 0.6 mg into the skin daily. 0.6 mg once daily for 1 week; increase by 0.6 mg daily at weekly intervals to a target dose of 3 mg once daily    Dispense:  3 mL    Refill:  2   liraglutide (VICTOZA) 18 MG/3ML SOPN    Sig: Inject 0.6 mg into the skin daily. 0.6 mg once daily for 1 week; increase by 0.6 mg  daily at weekly intervals to a target dose of 1.8 mg once daily    Dispense:  3 mL    Refill:  2   No orders of the defined types were placed in this encounter.    No follow-ups on file.     Montel Culver, MD, [facility]   Primary Care Sports Medicine Primary Care and Sports Medicine at [facility]

## 2022-05-30 NOTE — Telephone Encounter (Signed)
Please review.  KP

## 2022-05-30 NOTE — Telephone Encounter (Signed)
Called pt left VM to call back.  KP 

## 2022-05-30 NOTE — Telephone Encounter (Signed)
Please advise 

## 2022-05-30 NOTE — Patient Instructions (Addendum)
-   Start liraglutide and follow the following dosing schedule:  - Contact us if issues with liraglutide Rx so we can send alternate - Review information attached, monitor for the following: mass in the neck, dysphagia, dyspnea, persistent hoarseness - Return in 1 month

## 2022-05-30 NOTE — Assessment & Plan Note (Signed)
Chronic, ongoing symptomatology, had previously sought viscosupplementation, will follow-up on status (latest pending at specialty pharmacy)

## 2022-06-04 ENCOUNTER — Ambulatory Visit: Payer: BC Managed Care – PPO | Admitting: Family Medicine

## 2022-06-12 DIAGNOSIS — L299 Pruritus, unspecified: Secondary | ICD-10-CM | POA: Diagnosis not present

## 2022-07-01 ENCOUNTER — Encounter: Payer: Self-pay | Admitting: Family Medicine

## 2022-07-01 ENCOUNTER — Ambulatory Visit (INDEPENDENT_AMBULATORY_CARE_PROVIDER_SITE_OTHER): Payer: BC Managed Care – PPO | Admitting: Family Medicine

## 2022-07-01 VITALS — BP 118/70 | HR 88 | Ht 68.0 in | Wt 243.0 lb

## 2022-07-01 DIAGNOSIS — R112 Nausea with vomiting, unspecified: Secondary | ICD-10-CM | POA: Diagnosis not present

## 2022-07-01 DIAGNOSIS — Z7689 Persons encountering health services in other specified circumstances: Secondary | ICD-10-CM

## 2022-07-01 MED ORDER — PHENTERMINE HCL 30 MG PO CAPS: 30.0000 mg | ORAL_CAPSULE | ORAL | 0 refills | Status: DC

## 2022-07-01 MED ORDER — ONDANSETRON HCL 4 MG PO TABS: 4.0000 mg | ORAL_TABLET | Freq: Three times a day (TID) | ORAL | 0 refills | Status: DC | PRN

## 2022-07-01 MED ORDER — TOPIRAMATE 25 MG PO CPSP: 25.0000 mg | ORAL_CAPSULE | Freq: Two times a day (BID) | ORAL | 0 refills | Status: DC

## 2022-07-01 NOTE — Assessment & Plan Note (Signed)
Tolerating phentermine 50 mg and topiramate 25 mg well without issues, has noted interval weight loss without adverse effects reported from a cardiovascular, gastrointestinal, or sleep related standpoint.  - Titrate phentermine to 30 mg daily - Titrate topiramate initially 25 mg twice daily, if tolerating well after a few weeks can further self-titrate to 50 mg twice daily - Patient advised to contact us in 1 month if needing refills of current doses - If tolerating well and wishing to further increase phentermine, we will coordinate an office visit

## 2022-07-01 NOTE — Patient Instructions (Addendum)
-   Take new dose phentermine 30 mg first thing in the morning empty stomach - Increase topiramate 25 mg to twice daily - If tolerating topiramate well, after 2 weeks can increase to topiramate 50 mg (2 capsules) twice daily - Contact us at the end of current phentermine dose for refill/dose adjustment - If requesting dose adjustment, we will coordinate in office follow-up - Can use Zofran as needed for nausea

## 2022-07-01 NOTE — Assessment & Plan Note (Signed)
Chronic condition, primarily symptomatic relating to travel and associated lifestyle changes.  Has not noted any worsening since initiation of phentermine/topiramate.  - Can use Zofran as needed - Recalcitrant symptoms to be addressed with referral to GI

## 2022-07-01 NOTE — Progress Notes (Signed)
     Primary Care / Sports Medicine Office Visit  Patient Information:  Patient ID: Sarah Lowe, female DOB: [birth year] Age: 42 y.o. MRN: [id number]   Sarah Lowe is a pleasant 42 y.o. female presenting with the following:  Chief Complaint  Patient presents with   Weight Check    Vitals:   07/01/22 0929  BP: 118/70  Pulse: 88  SpO2: 98%   Vitals:   07/01/22 0929  Weight: 243 lb (110.2 kg)  Height: 5\' 8"  (1.727 m)   Body mass index is 36.95 kg/m.  No results found.   Independent interpretation of notes and tests performed by another provider:   None  Procedures performed:   None  Pertinent History, Exam, Impression, and Recommendations:   Rhodes was seen today for weight check.  Encounter for weight management Assessment & Plan: Tolerating phentermine 50 mg and topiramate 25 mg well without issues, has noted interval weight loss without adverse effects reported from a cardiovascular, gastrointestinal, or sleep related standpoint.  - Titrate phentermine to 30 mg daily - Titrate topiramate initially 25 mg twice daily, if tolerating well after a few weeks can further self-titrate to 50 mg twice daily - Patient advised to contact us in 1 month if needing refills of current doses - If tolerating well and wishing to further increase phentermine, we will coordinate an office visit  Orders: -     Phentermine HCl; Take 1 capsule (30 mg total) by mouth every morning.  Dispense: 30 capsule; Refill: 0 -     Topiramate; Take 1 capsule (25 mg total) by mouth 2 (two) times daily. Can increase to 2 capsules (50 mg total) twice daily after 2 weeks  Dispense: 120 capsule; Refill: 0  Nausea and vomiting, unspecified vomiting type Assessment & Plan: Chronic condition, primarily symptomatic relating to travel and associated lifestyle changes.  Has not noted any worsening since initiation of phentermine/topiramate.  - Can use Zofran as needed - Recalcitrant symptoms  to be addressed with referral to GI  Orders: -     Ondansetron HCl; Take 1 tablet (4 mg total) by mouth every 8 (eight) hours as needed for nausea or vomiting.  Dispense: 20 tablet; Refill: 0     Orders & Medications Meds ordered this encounter  Medications   phentermine 30 MG capsule    Sig: Take 1 capsule (30 mg total) by mouth every morning.    Dispense:  30 capsule    Refill:  0   ondansetron (ZOFRAN) 4 MG tablet    Sig: Take 1 tablet (4 mg total) by mouth every 8 (eight) hours as needed for nausea or vomiting.    Dispense:  20 tablet    Refill:  0   topiramate (TOPAMAX) 25 MG capsule    Sig: Take 1 capsule (25 mg total) by mouth 2 (two) times daily. Can increase to 2 capsules (50 mg total) twice daily after 2 weeks    Dispense:  120 capsule    Refill:  0   No orders of the defined types were placed in this encounter.    No follow-ups on file.     Jerrol Banana, MD, [facility]   Primary Care Sports Medicine Primary Care and Sports Medicine at [facility]

## 2022-07-25 ENCOUNTER — Other Ambulatory Visit: Payer: Self-pay | Admitting: Family Medicine

## 2022-07-25 DIAGNOSIS — Z7689 Persons encountering health services in other specified circumstances: Secondary | ICD-10-CM

## 2022-07-28 NOTE — Telephone Encounter (Signed)
Please review.  KP

## 2022-08-01 ENCOUNTER — Other Ambulatory Visit: Payer: Self-pay | Admitting: Family Medicine

## 2022-08-01 ENCOUNTER — Telehealth: Payer: Self-pay | Admitting: Family Medicine

## 2022-08-01 DIAGNOSIS — Z7689 Persons encountering health services in other specified circumstances: Secondary | ICD-10-CM

## 2022-08-01 MED ORDER — PHENTERMINE HCL 30 MG PO CAPS: 30.0000 mg | ORAL_CAPSULE | ORAL | 1 refills | Status: DC

## 2022-08-01 NOTE — Telephone Encounter (Signed)
Requested medication (s) are due for refill today: yes  Requested medication (s) are on the active medication list: yes  Last refill:  07/01/22  Future visit scheduled: yes  Notes to clinic:  Unable to refill per protocol, cannot delegate.      Requested Prescriptions  Pending Prescriptions Disp Refills   phentermine 30 MG capsule 30 capsule 0    Sig: Take 1 capsule (30 mg total) by mouth every morning.     Not Delegated - Neurology: Anticonvulsants - Controlled - phentermine hydrochloride Failed - 08/01/2022  9:59 AM      Failed - This refill cannot be delegated      Passed - eGFR in normal range and within 360 days    eGFR  Date Value Ref Range Status  02/10/2022 73 >59 mL/min/1.73 Final         Passed - Cr in normal range and within 360 days    Creatinine, Ser  Date Value Ref Range Status  02/10/2022 1.00 0.57 - 1.00 mg/dL Final         Passed - Last BP in normal range    BP Readings from Last 1 Encounters:  07/01/22 118/70         Passed - Valid encounter within last 6 months    Recent Outpatient Visits           1 month ago Encounter for weight management   [place] [facility], Ocie Bob, MD   2 months ago Encounter for weight management   [place] [facility] at [facility], Ocie Bob, MD   5 months ago Annual physical exam   [facility] Medicine at [facility], Ocie Bob, MD   1 year ago Patellofemoral syndrome, left   [place] [facility] at [facility], Ocie Bob, MD   1 year ago Patellofemoral syndrome, left   [facility] Medicine at [facility], Ocie Bob, MD       Future Appointments             In 6 months Ashley Royalty, Ocie Bob, MD [facility] Primary Care & Sports Medicine at [facility], [facility]            Passed - Weight completed in the last 3 months    Wt  Readings from Last 1 Encounters:  07/01/22 243 lb (110.2 kg)

## 2022-08-01 NOTE — Telephone Encounter (Signed)
Refill sent in.  KP 

## 2022-08-01 NOTE — Telephone Encounter (Signed)
Medication Refill - Medication: phentermine 30 MG capsule  Has the patient contacted their pharmacy? Yes.    Please view refill request on 07/25/22. Pt states that she is on her last pill.      Preferred Pharmacy (with phone number or street name): [facility] - [place], [place] - [address]  Phone: [phone] Fax: [phone]  Has the patient been seen for an appointment in the last year OR does the patient have an upcoming appointment? Yes.    Agent: Please be advised that RX refills may take up to 3 business days. We ask that you follow-up with your pharmacy.

## 2022-08-01 NOTE — Telephone Encounter (Signed)
Requested medication (s) are due for refill today: yes  Requested medication (s) are on the active medication list: yes  Last refill:  07/01/22  Future visit scheduled: yes  Notes to clinic:  Unable to refill per protocol, cannot delegate.      Requested Prescriptions  Pending Prescriptions Disp Refills   phentermine 30 MG capsule [Pharmacy Med Name: phentermine 30 mg capsule] 30 capsule 0    Sig: TAKE ONE CAPSULE BY MOUTH EVERY MORNING     Not Delegated - Neurology: Anticonvulsants - Controlled - phentermine hydrochloride Failed - 08/01/2022  9:05 AM      Failed - This refill cannot be delegated      Passed - eGFR in normal range and within 360 days    eGFR  Date Value Ref Range Status  02/10/2022 73 >59 mL/min/1.73 Final         Passed - Cr in normal range and within 360 days    Creatinine, Ser  Date Value Ref Range Status  02/10/2022 1.00 0.57 - 1.00 mg/dL Final         Passed - Last BP in normal range    BP Readings from Last 1 Encounters:  07/01/22 118/70         Passed - Valid encounter within last 6 months    Recent Outpatient Visits           1 month ago Encounter for weight management   [place] [facility], Ocie Bob, MD   2 months ago Encounter for weight management   [place] [facility], Ocie Bob, MD   5 months ago Annual physical exam   [facility] at [facility], Ocie Bob, MD   1 year ago Patellofemoral syndrome, left   [place] [facility] at [facility], Ocie Bob, MD   1 year ago Patellofemoral syndrome, left   [facility] Medicine at [facility], Ocie Bob, MD       Future Appointments             In 6 months Ashley Royalty, Ocie Bob, MD [facility] Primary Care & Sports Medicine at [facility], [facility]            Passed - Weight  completed in the last 3 months    Wt Readings from Last 1 Encounters:  07/01/22 243 lb (110.2 kg)

## 2022-08-01 NOTE — Telephone Encounter (Signed)
Please review.  KP

## 2022-08-01 NOTE — Telephone Encounter (Signed)
Medication Refill - Medication: phentermine 30 MG capsule [Pharmacy Med Name: phentermine 30 mg capsule]   Has the patient contacted their pharmacy? Yes.   No refills  Preferred Pharmacy (with phone number or street name):  [facility] - [place], [place] - [address] Phone: [phone]  Fax: [phone]     Has the patient been seen for an appointment in the last year OR does the patient have an upcoming appointment? No.  Agent: Please be advised that RX refills may take up to 3 business days. We ask that you follow-up with your pharmacy.  Patient took her last pill today

## 2022-08-26 ENCOUNTER — Ambulatory Visit (INDEPENDENT_AMBULATORY_CARE_PROVIDER_SITE_OTHER): Payer: BC Managed Care – PPO | Admitting: Family Medicine

## 2022-08-26 ENCOUNTER — Encounter: Payer: Self-pay | Admitting: Family Medicine

## 2022-08-26 VITALS — BP 118/78 | HR 88 | Ht 68.0 in | Wt 234.0 lb

## 2022-08-26 DIAGNOSIS — E282 Polycystic ovarian syndrome: Secondary | ICD-10-CM | POA: Diagnosis not present

## 2022-08-26 DIAGNOSIS — S76011A Strain of muscle, fascia and tendon of right hip, initial encounter: Secondary | ICD-10-CM

## 2022-08-26 MED ORDER — TIZANIDINE HCL 4 MG PO TABS
ORAL_TABLET | ORAL | 1 refills | Status: DC
Start: 2022-08-26 — End: 2022-12-29

## 2022-08-26 MED ORDER — DICLOFENAC SODIUM 75 MG PO TBEC: DELAYED_RELEASE_TABLET | ORAL | 0 refills | Status: DC

## 2022-08-26 NOTE — Progress Notes (Signed)
     Primary Care / Sports Medicine Office Visit  Patient Information:  Patient ID: Sarah Lowe, female DOB: 1980-05-11 Age: 42 y.o. MRN: [id number]   Sarah Lowe is a pleasant 42 y.o. female presenting with the following:  Chief Complaint  Patient presents with   Hip Pain    Right, For a couple months, riding horse made it worst, started low mid back    Vitals:   08/26/22 0842  BP: 118/78  Pulse: 88  SpO2: 99%   Vitals:   08/26/22 0842  Weight: 234 lb (106.1 kg)  Height: 5\' 8"  (1.727 m)   Body mass index is 35.58 kg/m.  No results found.   Independent interpretation of notes and tests performed by another provider:   None  Procedures performed:   None  Pertinent History, Exam, Impression, and Recommendations:   Phung was seen today for hip pain.  Strain of gluteus medius of right lower extremity, initial encounter Assessment & Plan: Atraumatic, ongoing since February 2024 in the setting of increased horseback riding, multiple flights to the [facility], noted primarily following movement after period of immobility (getting up after standing, etc.).  Has noted recent radiation into the thigh, no burning, tingling, paresthesias, no weakness.  Has been seeing chiropractic care with positive though short-lived response.  Examination shows focality throughout the distribution of the right gluteus medius, tenderness at the ipsilateral SI joint and greater trochanteric region, negative straight leg raise, exam otherwise benign.  Plan as follows: - Diclofenac regimen - As needed tizanidine - Home-based rehab - X-rays ordered for patient to proceed with if still symptomatic in 4 to 6 weeks   Orders: -     tiZANidine HCl; Take 1 tablet (4 mg total) by mouth at bedtime for 14 days, THEN 1 tablet (4 mg total) every 8 (eight) hours as needed  Dispense: 30 tablet; Refill: 1 -     Diclofenac Sodium; 1 tablet (75 mg total) 2 (two) times daily for 14 days, THEN  1 tablet (75 mg total) 2 (two) times daily as needed  Dispense: 30 tablet; Refill: 0 -     DG HIP UNILAT W OR W/O PELVIS 2-3 VIEWS RIGHT; Future  PCOS (polycystic ovarian syndrome) Assessment & Plan: Ongoing symptomatology, request further evaluation by endocrinology, referral placed in that regard today.  Orders: -     Ambulatory referral to Endocrinology     Orders & Medications Meds ordered this encounter  Medications   tiZANidine (ZANAFLEX) 4 MG tablet    Sig: Take 1 tablet (4 mg total) by mouth at bedtime for 14 days, THEN 1 tablet (4 mg total) every 8 (eight) hours as needed    Dispense:  30 tablet    Refill:  1   diclofenac (VOLTAREN) 75 MG EC tablet    Sig: 1 tablet (75 mg total) 2 (two) times daily for 14 days, THEN 1 tablet (75 mg total) 2 (two) times daily as needed    Dispense:  30 tablet    Refill:  0   Orders Placed This Encounter  Procedures   DG Hip Unilat W OR W/O Pelvis 2-3 Views Right   Ambulatory referral to Endocrinology     No follow-ups on file.     Jerrol Banana, MD, [facility]   Primary Care Sports Medicine Primary Care and Sports Medicine at [facility]

## 2022-08-27 DIAGNOSIS — S76011A Strain of muscle, fascia and tendon of right hip, initial encounter: Secondary | ICD-10-CM | POA: Insufficient documentation

## 2022-08-27 DIAGNOSIS — M25551 Pain in right hip: Secondary | ICD-10-CM | POA: Insufficient documentation

## 2022-08-27 NOTE — Assessment & Plan Note (Signed)
Ongoing symptomatology, request further evaluation by endocrinology, referral placed in that regard today.

## 2022-08-27 NOTE — Assessment & Plan Note (Signed)
Atraumatic, ongoing since February 2024 in the setting of increased horseback riding, multiple flights to the [facility], noted primarily following movement after period of immobility (getting up after standing, etc.).  Has noted recent radiation into the thigh, no burning, tingling, paresthesias, no weakness.  Has been seeing chiropractic care with positive though short-lived response.  Examination shows focality throughout the distribution of the right gluteus medius, tenderness at the ipsilateral SI joint and greater trochanteric region, negative straight leg raise, exam otherwise benign.  Plan as follows: - Diclofenac regimen - As needed tizanidine - Home-based rehab - X-rays ordered for patient to proceed with if still symptomatic in 4 to 6 weeks

## 2022-08-27 NOTE — Patient Instructions (Signed)
-   Dose diclofenac x 2 weeks scheduled then twice daily as needed thereafter - Dose tizanidine on as-needed basis (muscle relaxer) - Start home-based rehab - If still symptomatic at the 4 to 6-week mark, obtain x-rays and schedule follow-up - Referral coordinator will contact to schedule endocrinology visit

## 2022-09-21 ENCOUNTER — Other Ambulatory Visit: Payer: Self-pay | Admitting: Family Medicine

## 2022-09-21 DIAGNOSIS — Z7689 Persons encountering health services in other specified circumstances: Secondary | ICD-10-CM

## 2022-09-23 NOTE — Telephone Encounter (Signed)
Please review.  KP

## 2022-09-23 NOTE — Telephone Encounter (Signed)
Requested medication (s) are due for refill today: yes  Requested medication (s) are on the active medication list: yes  Last refill:  08/01/22  Future visit scheduled: yes  Notes to clinic:  Unable to refill per protocol, cannot delegate.      Requested Prescriptions  Pending Prescriptions Disp Refills   phentermine 30 MG capsule [Pharmacy Med Name: phentermine 30 mg capsule] 30 capsule 1    Sig: TAKE ONE CAPSULE BY MOUTH IN THE MORNING     Not Delegated - Neurology: Anticonvulsants - Controlled - phentermine hydrochloride Failed - 09/21/2022  8:03 AM      Failed - This refill cannot be delegated      Passed - eGFR in normal range and within 360 days    eGFR  Date Value Ref Range Status  02/10/2022 73 >59 mL/min/1.73 Final         Passed - Cr in normal range and within 360 days    Creatinine, Ser  Date Value Ref Range Status  02/10/2022 1.00 0.57 - 1.00 mg/dL Final         Passed - Last BP in normal range    BP Readings from Last 1 Encounters:  08/26/22 118/78         Passed - Valid encounter within last 6 months    Recent Outpatient Visits           4 weeks ago Strain of gluteus medius of right lower extremity, initial encounter   [place] [facility], Ocie Bob, MD   2 months ago Encounter for weight management   [place] [facility] at [facility], Ocie Bob, MD   3 months ago Encounter for weight management   [place] [facility] at [facility], Ocie Bob, MD   7 months ago Annual physical exam   [facility] Primary Care & Sports Medicine at [facility] Ocie Bob, MD   1 year ago Patellofemoral syndrome, left   [facility] Care [facility] Medicine at [facility], Ocie Bob, MD       Future Appointments             In 4 months Ashley Royalty, Ocie Bob, MD [facility] Primary Care & Sports Medicine at [facility],  [facility]            Passed - Weight completed in the last 3 months    Wt Readings from Last 1 Encounters:  08/26/22 234 lb (106.1 kg)

## 2022-09-24 ENCOUNTER — Other Ambulatory Visit: Payer: Self-pay | Admitting: Family Medicine

## 2022-09-30 IMAGING — CR DG LUMBAR SPINE COMPLETE 4+V
5 series · 5 of 5 positions shown · non-contrast
Comparison: None.

CLINICAL DATA: Absent bilateral lower extremity patellar and
Achilles reflexes.

EXAM:
LUMBAR SPINE - COMPLETE 4+ VIEW

[l-spine ap]
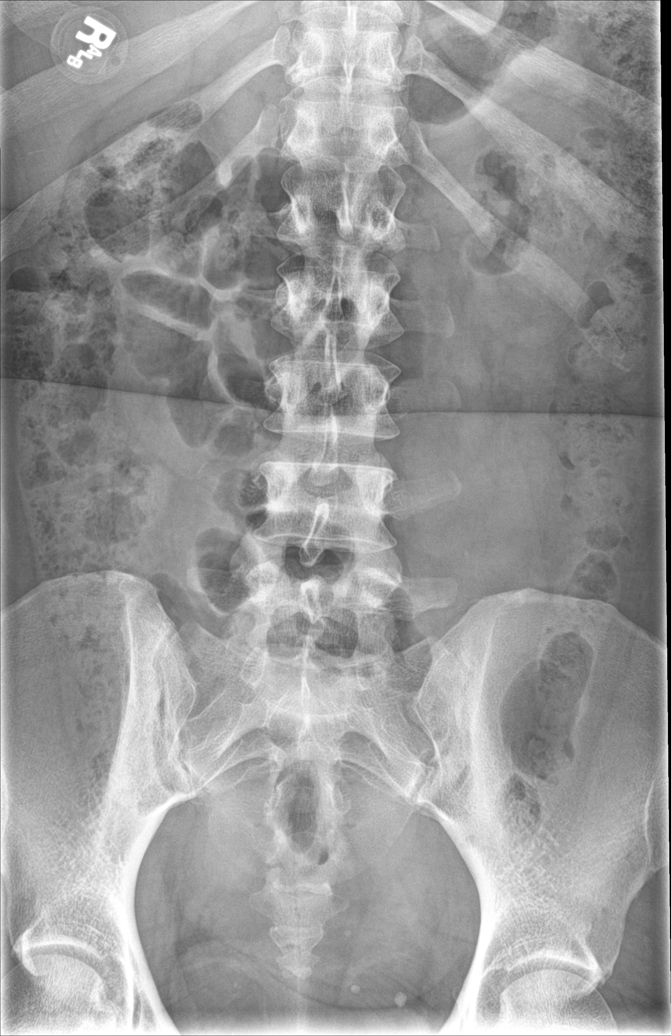

[l-spine obl (1 of 2)]
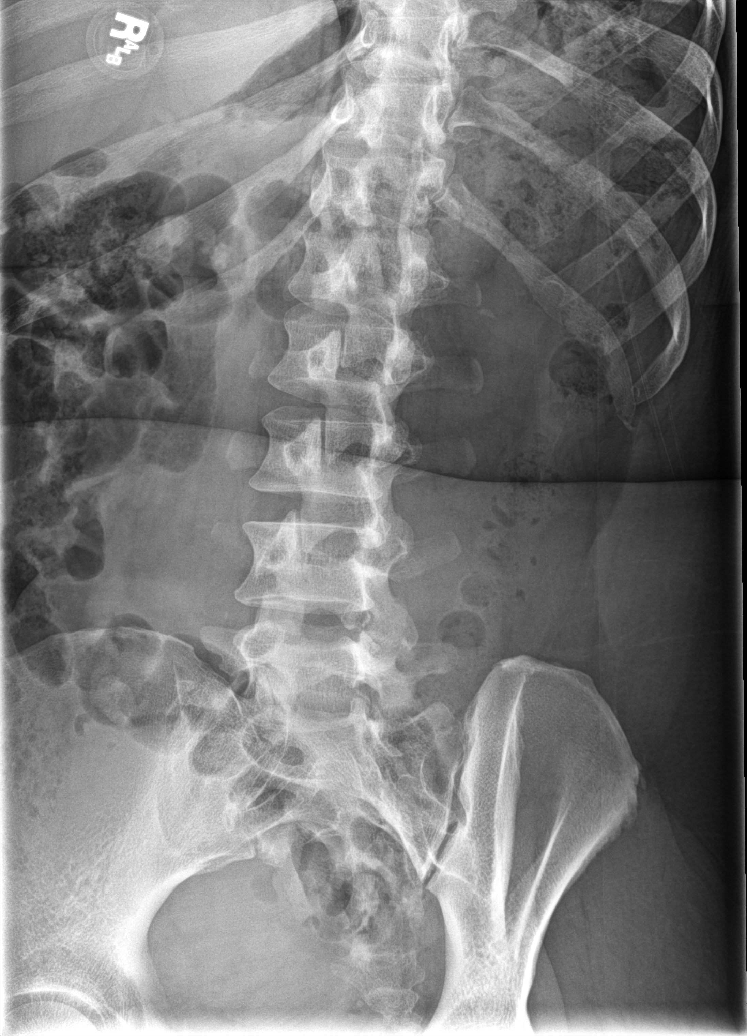

[l-spine obl (2 of 2)]
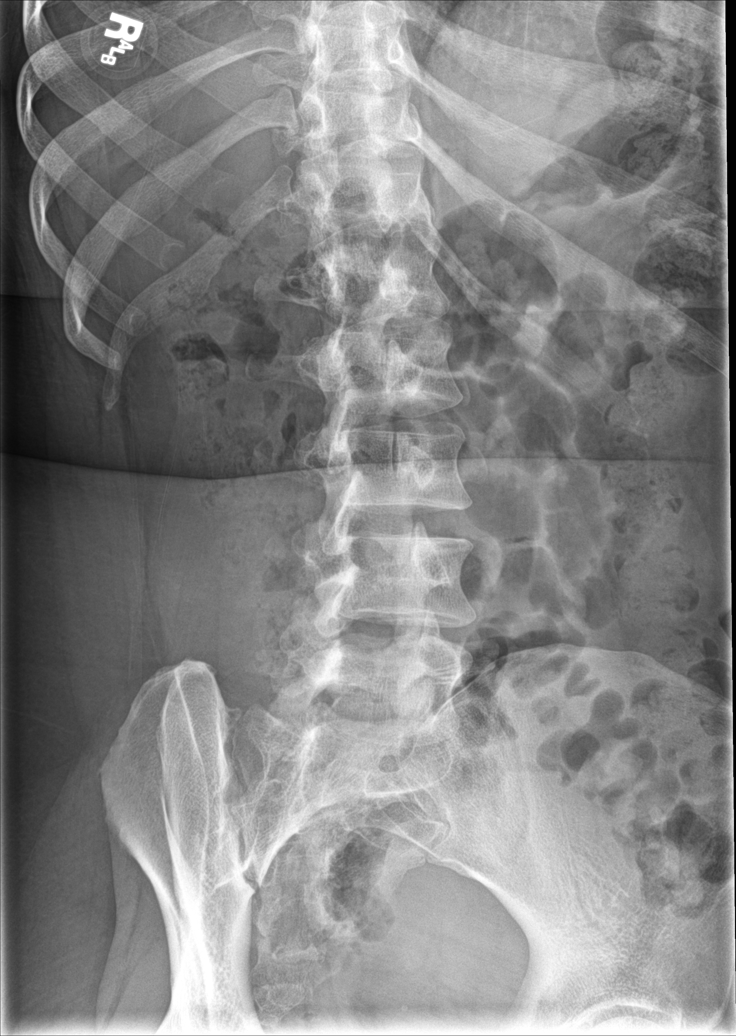

[l-spine lat]
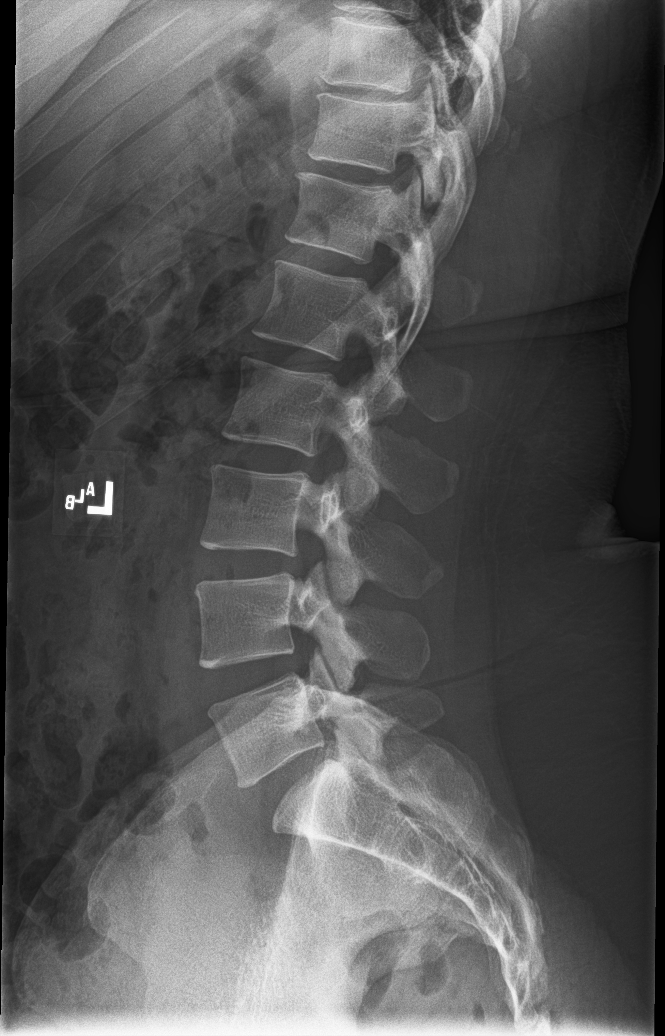

[l-spine spot]
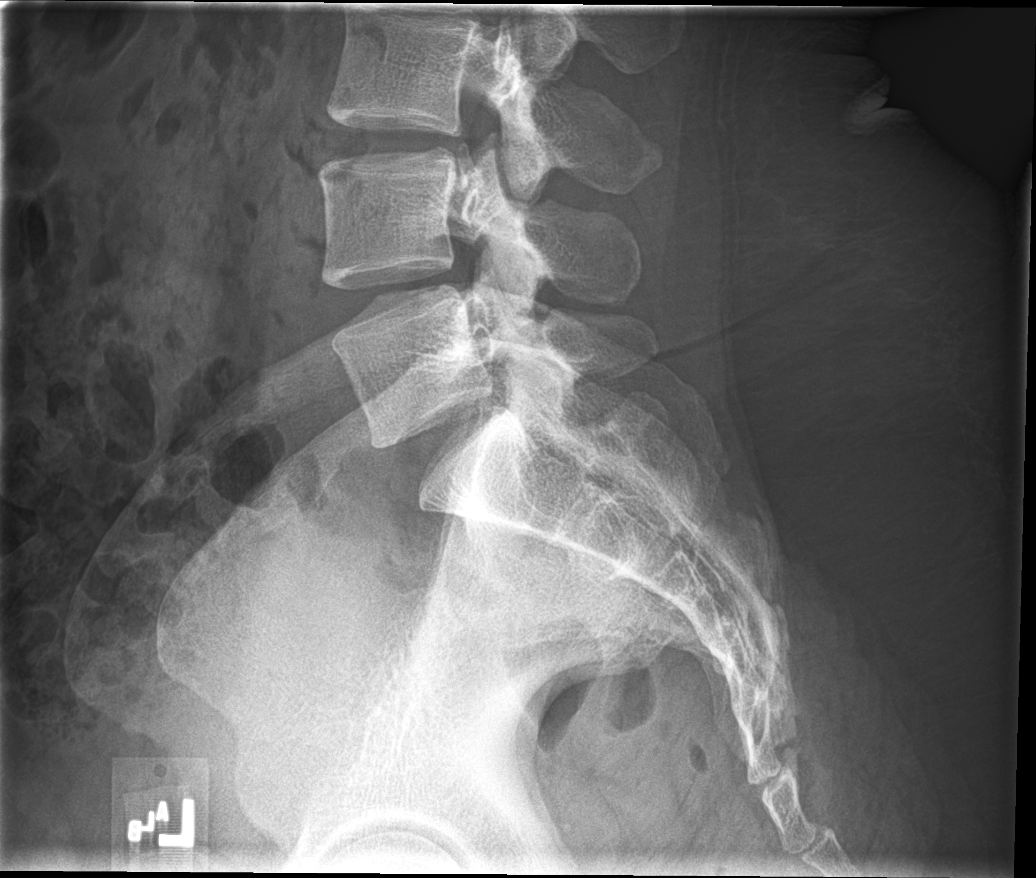

[5 of 5 positions shown; findings below may reference images not displayed]

FINDINGS: The alignment is maintained. Vertebral body heights are normal.
There is no listhesis. The posterior elements are intact. Disc
spaces are preserved. No fracture, focal bone abnormality, or
visualized pars defects. Sacroiliac joints are symmetric and normal.
IMPRESSION: Negative radiographs of the lumbar spine.

## 2022-10-08 ENCOUNTER — Encounter: Payer: Self-pay | Admitting: Family Medicine

## 2022-10-08 ENCOUNTER — Ambulatory Visit: Payer: BC Managed Care – PPO | Admitting: Family Medicine

## 2022-10-08 VITALS — BP 126/78 | HR 80 | Ht 67.0 in | Wt 234.0 lb

## 2022-10-08 DIAGNOSIS — Z7689 Persons encountering health services in other specified circumstances: Secondary | ICD-10-CM | POA: Diagnosis not present

## 2022-10-08 DIAGNOSIS — K219 Gastro-esophageal reflux disease without esophagitis: Secondary | ICD-10-CM | POA: Diagnosis not present

## 2022-10-08 DIAGNOSIS — M7661 Achilles tendinitis, right leg: Secondary | ICD-10-CM | POA: Diagnosis not present

## 2022-10-08 MED ORDER — PANTOPRAZOLE SODIUM 40 MG PO TBEC
40.0000 mg | DELAYED_RELEASE_TABLET | Freq: Every day | ORAL | 1 refills | Status: DC
Start: 2022-10-08 — End: 2023-05-18

## 2022-10-08 NOTE — Progress Notes (Signed)
     Primary Care / Sports Medicine Office Visit  Patient Information:  Patient ID: Sarah Lowe, female DOB: Feb 17, 1981 Age: 42 y.o. MRN: [id number]   Sarah Lowe is a pleasant 42 y.o. female presenting with the following:  Chief Complaint  Patient presents with   Achilles heel    Right, lump, 2 weeks    Vitals:   10/08/22 0846  BP: 126/78  Pulse: 80  SpO2: 100%   Vitals:   10/08/22 0846  Weight: 234 lb (106.1 kg)  Height: 5\' 7"  (1.702 m)   Body mass index is 36.65 kg/m.  No results found.   Independent interpretation of notes and tests performed by another provider:   None  Procedures performed:   None  Pertinent History, Exam, Impression, and Recommendations:   Sarah Lowe was seen today for achilles heel.  Achilles tendinitis, right leg Assessment & Plan: In the setting of activity ramp up over past few weeks (jogging alongside horses), no interference with ADLs, noting pain upon awakening and with motion after rest.  Findings with -Homan's, -Thompson's, painful single leg heel rise, tenderness mid-distal Achilles with swelling, no defect or discoloration  Plan as follows: - Use heel cup inserts x 2-4 weeks - Use topical antiinflammatory Voltaren gel (diclofenac 1%) 2-4 times daily - Start home-based rehab exercises - Contact for any persistent symptoms in 4 weeks - Suboptimal progress can be addressed by consideration of CAM boot, oral NSAIDs, formal PT, imaging   Encounter for weight management Assessment & Plan: Concern for phentermine related gastric adverse effects. Plan as follows:  - Stop phentermine - Can continue topiramate - Rx faxed to [facility] for semaglutide - Once started, use 0.25 mg strength weekly - Can increase to next 0.5 mg strength after 4 weeks   Gastroesophageal reflux disease, unspecified whether esophagitis present Assessment & Plan: Noting emesis occurring at night, noted over past several days, no  change in diet or other factors. Has been dosing phentermine for weight management.   Given history, most likely secondary to GERD exacerbation, plan as follows: - Stop phentermine - Start pantoprazole in evening (30 minutes prior to dinner) x 2 weeks then as-needed - Review information attached regarding further lifestyle modifications to reduce symptoms - Contact for any persistent symptoms - Can consider GI referral  Orders: -     Pantoprazole Sodium; Take 1 tablet (40 mg total) by mouth daily.  Dispense: 30 tablet; Refill: 1   I provided a total time of 42 minutes including both face-to-face and non-face-to-face time on 10/08/2022 inclusive of time utilized for medical chart review, information gathering, care coordination with staff, and documentation completion.   Orders & Medications Meds ordered this encounter  Medications   pantoprazole (PROTONIX) 40 MG tablet    Sig: Take 1 tablet (40 mg total) by mouth daily.    Dispense:  30 tablet    Refill:  1   No orders of the defined types were placed in this encounter.    No follow-ups on file.     Jerrol Banana, MD, [facility]   Primary Care Sports Medicine Primary Care and Sports Medicine at [facility]

## 2022-10-08 NOTE — Assessment & Plan Note (Signed)
In the setting of activity ramp up over past few weeks (jogging alongside horses), no interference with ADLs, noting pain upon awakening and with motion after rest.  Findings with -Homan's, -Thompson's, painful single leg heel rise, tenderness mid-distal Achilles with swelling, no defect or discoloration  Plan as follows: - Use heel cup inserts x 2-4 weeks - Use topical antiinflammatory Voltaren gel (diclofenac 1%) 2-4 times daily - Start home-based rehab exercises - Contact for any persistent symptoms in 4 weeks - Suboptimal progress can be addressed by consideration of CAM boot, oral NSAIDs, formal PT, imaging

## 2022-10-08 NOTE — Patient Instructions (Addendum)
For Achilles - Use heel cup inserts x 2-4 weeks - Use topical antiinflammatory Voltaren gel (diclofenac 1%) 2-4 times daily - Start home-based rehab exercises - Contact for any persistent symptoms in 4 weeks  For vomiting - Stop phentermine - Start pantoprazole in evening (30 minutes prior to dinner) x 2 weeks then as-needed - Review information attached regarding further lifestyle modifications to reduce symptoms - Contact for any persistent symptoms  For weight management - Stop phentermine - Can continue topiramate - Rx faxed to [facility] for semaglutide - Once started, use 0.25 mg strength weekly - Can increase to next 0.5 mg strength after 4 weeks - Contact our office in 1-2 months for status update

## 2022-10-08 NOTE — Assessment & Plan Note (Signed)
Noting emesis occurring at night, noted over past several days, no change in diet or other factors. Has been dosing phentermine for weight management.   Given history, most likely secondary to GERD exacerbation, plan as follows: - Stop phentermine - Start pantoprazole in evening (30 minutes prior to dinner) x 2 weeks then as-needed - Review information attached regarding further lifestyle modifications to reduce symptoms - Contact for any persistent symptoms - Can consider GI referral

## 2022-10-08 NOTE — Assessment & Plan Note (Signed)
Concern for phentermine related gastric adverse effects. Plan as follows:  - Stop phentermine - Can continue topiramate - Rx faxed to [facility] for semaglutide - Once started, use 0.25 mg strength weekly - Can increase to next 0.5 mg strength after 4 weeks

## 2022-10-29 ENCOUNTER — Encounter: Payer: Self-pay | Admitting: Family Medicine

## 2022-10-30 ENCOUNTER — Other Ambulatory Visit: Payer: Self-pay

## 2022-10-30 MED ORDER — OZEMPIC (0.25 OR 0.5 MG/DOSE) 2 MG/3ML ~~LOC~~ SOPN: 0.5000 mg | PEN_INJECTOR | SUBCUTANEOUS | 2 refills | Status: DC

## 2022-11-11 DIAGNOSIS — U071 COVID-19: Secondary | ICD-10-CM | POA: Diagnosis not present

## 2022-11-15 ENCOUNTER — Other Ambulatory Visit: Payer: Self-pay | Admitting: Family Medicine

## 2022-11-15 DIAGNOSIS — J309 Allergic rhinitis, unspecified: Secondary | ICD-10-CM

## 2022-11-17 NOTE — Telephone Encounter (Signed)
Requested medication (s) are due for refill today:yes  Requested medication (s) are on the active medication list: yes  Last refill:  12/11/21 1 each  Future visit scheduled: yes  Notes to clinic:  off protocol med not delegated to NT to reorder or refuse   Requested Prescriptions  Pending Prescriptions Disp Refills   QNASL 80 MCG/ACT AERS [Pharmacy Med Name: QNASL 80 mcg/actuation nasal aerosol spray] 10.6 g 0    Sig: place TWO SPRAYS into BOTH nostrils daily     Off-Protocol Failed - 11/15/2022 10:13 AM      Failed - Medication not assigned to a protocol, review manually.      Passed - Valid encounter within last 12 months    Recent Outpatient Visits           1 month ago Achilles tendinitis, right leg   [place] [facility] at [facility], Ocie Bob, MD   2 months ago Strain of gluteus medius of right lower extremity, initial encounter   [facility] Care & [facility] Medicine at [facility], Ocie Bob, MD   4 months ago Encounter for weight management   [place] [facility] at [facility], Ocie Bob, MD   5 months ago Encounter for weight management   [facility] Medicine at [facility], Ocie Bob, MD   9 months ago Annual physical exam   [facility] Medicine at [facility], Ocie Bob, MD       Future Appointments             In 2 months Ashley Royalty, Ocie Bob, MD [facility] Primary Care & Sports Medicine at [facility], [facility]

## 2022-11-18 NOTE — Telephone Encounter (Signed)
Please advise 

## 2022-12-02 ENCOUNTER — Encounter: Payer: Self-pay | Admitting: Family Medicine

## 2022-12-02 ENCOUNTER — Other Ambulatory Visit: Payer: Self-pay

## 2022-12-02 MED ORDER — SEMAGLUTIDE (1 MG/DOSE) 4 MG/3ML ~~LOC~~ SOPN: 1.0000 mg | PEN_INJECTOR | SUBCUTANEOUS | 0 refills | Status: DC

## 2022-12-03 ENCOUNTER — Telehealth: Payer: Self-pay | Admitting: Family Medicine

## 2022-12-03 NOTE — Telephone Encounter (Signed)
Copied from CRM [id number]. Topic: General - Inquiry >> Dec 03, 2022 12:29 PM Marlow Baars wrote: Reason for CRM: Clydie Braun with [facility] called to get the ok to compound the patients Semaglutide medication. Please assist patient further

## 2022-12-04 NOTE — Telephone Encounter (Signed)
PC to pharmacy, ok to compound

## 2022-12-17 ENCOUNTER — Other Ambulatory Visit (HOSPITAL_COMMUNITY): Payer: Self-pay | Admitting: Family Medicine

## 2022-12-17 DIAGNOSIS — Z1231 Encounter for screening mammogram for malignant neoplasm of breast: Secondary | ICD-10-CM

## 2022-12-29 ENCOUNTER — Ambulatory Visit
Admission: RE | Admit: 2022-12-29 | Discharge: 2022-12-29 | Disposition: A | Discharge status: Home / Self Care | Payer: BC Managed Care – PPO | Attending: Family Medicine | Admitting: Family Medicine

## 2022-12-29 ENCOUNTER — Ambulatory Visit
Admission: RE | Admit: 2022-12-29 | Discharge: 2022-12-29 | Disposition: A | Discharge status: Home / Self Care | Payer: BC Managed Care – PPO | Source: Ambulatory Visit | Attending: Family Medicine | Admitting: Family Medicine

## 2022-12-29 ENCOUNTER — Ambulatory Visit: Payer: BC Managed Care – PPO | Admitting: Family Medicine

## 2022-12-29 ENCOUNTER — Encounter: Payer: Self-pay | Admitting: Family Medicine

## 2022-12-29 VITALS — BP 118/78 | HR 74 | Ht 67.0 in | Wt 237.0 lb

## 2022-12-29 DIAGNOSIS — S76011A Strain of muscle, fascia and tendon of right hip, initial encounter: Secondary | ICD-10-CM

## 2022-12-29 DIAGNOSIS — Z91038 Other insect allergy status: Secondary | ICD-10-CM | POA: Diagnosis not present

## 2022-12-29 DIAGNOSIS — Z7689 Persons encountering health services in other specified circumstances: Secondary | ICD-10-CM

## 2022-12-29 DIAGNOSIS — R1030 Lower abdominal pain, unspecified: Secondary | ICD-10-CM | POA: Diagnosis not present

## 2022-12-29 DIAGNOSIS — M25551 Pain in right hip: Secondary | ICD-10-CM

## 2022-12-29 MED ORDER — EPINEPHRINE 0.3 MG/0.3ML IJ SOAJ
0.3000 mg | Freq: Once | INTRAMUSCULAR | 0 refills | Status: DC | PRN
Start: 2022-12-29 — End: 2024-02-15

## 2022-12-29 NOTE — Assessment & Plan Note (Signed)
Ongoing symptoms last addressed 08/27/2022, medications ineffective, has not performed home-based rehab consistently, did obtain new x-rays today.  Pain primarily at the lower right back/hip, involves the buttock and outer hip, denies any radiation distally.  Has overall noted improvement with long flights and horseback riding.  Examination with the primary focality of symptoms at the right SI joint, gluteus medius origin, and right greater trochanter, negative straight leg raise, positive Pearlean Brownie and FADIR.  Findings are most consistent with gluteus medius involvement, SI joint arthralgia, and greater trochanteric syndrome as a whole.  Plan: - Can continue previously provide medications on as-needed basis - Focus on home-based rehab on a regular basis - Persistent symptoms can be addressed with consideration of formal PT, local ultrasound-guided injections

## 2022-12-29 NOTE — Assessment & Plan Note (Signed)
Has had serial titrations of semaglutide, currently on 1 mg weekly x 3 weeks.  Has not noted significant weight loss, some minor early satiety, and most recently minor constipation since being on the 1 mg dose.  Reviewed the nature of the medication, adjunct lifestyle changes to optimize response.  Plan: - Review information below on constipation management - Increase to next semaglutide dose based on compounding pharmacy (target 1.7 mg weekly range) - Increase physical activity and regular basis - Follow-up in 3 months

## 2022-12-29 NOTE — Patient Instructions (Addendum)
-   Finish out current semaglutide 1 mg weekly - Our office will work on sending next dose (contact us if is any issues noted) - Review constipation management information (see below) and make changes where applicable - Work on increasing daily physical activity (target 30 minutes of at least moderate intensity level activity) - Start the following exercises (see below) and perform for at least 4 weeks on a consistent basis - Return for follow-up in 3 months, contact for any question/concerns between now and then      Constipation Stepwise Treatment  Find a consistent over-the-counter regimen that allows for at least 1-2 smooth, formed, bowel movements daily. See steps below and reserve Steps 5 and 6 for times without a bowel movement x 2-3 days despite regular OTC regimen.

## 2022-12-29 NOTE — Progress Notes (Signed)
     Primary Care / Sports Medicine Office Visit  Patient Information:  Patient ID: Sarah Lowe, female DOB: 07/30/1980 Age: 42 y.o. MRN: [id number]   Sarah Lowe is a pleasant 42 y.o. female presenting with the following:  Chief Complaint  Patient presents with   Weight Check    Follow up on [place].    Vitals:   12/29/22 1009  BP: 118/78  Pulse: 74  SpO2: 99%   Vitals:   12/29/22 1009  Weight: 237 lb (107.5 kg)  Height: 5\' 7"  (1.702 m)   Body mass index is 37.12 kg/m.  No results found.   Independent interpretation of notes and tests performed by another provider:   Independent interpretation of right hip x-rays dated 12/29/2022 demonstrates no significant degenerative changes at the visualized femoroacetabular junctions, no enthesophyte formation, no acute osseous processes identified  Procedures performed:   None  Pertinent History, Exam, Impression, and Recommendations:   Problem List Items Addressed This Visit       Musculoskeletal and Integument   Greater trochanteric pain syndrome of right lower extremity    Ongoing symptoms last addressed 08/27/2022, medications ineffective, has not performed home-based rehab consistently, did obtain new x-rays today.  Pain primarily at the lower right back/hip, involves the buttock and outer hip, denies any radiation distally.  Has overall noted improvement with long flights and horseback riding.  Examination with the primary focality of symptoms at the right SI joint, gluteus medius origin, and right greater trochanter, negative straight leg raise, positive Pearlean Brownie and FADIR.  Findings are most consistent with gluteus medius involvement, SI joint arthralgia, and greater trochanteric syndrome as a whole.  Plan: - Can continue previously provide medications on as-needed basis - Focus on home-based rehab on a regular basis - Persistent symptoms can be addressed with consideration of formal PT, local  ultrasound-guided injections        Other   Insect sting allergy, current reaction, accidental or unintentional, sequela - Primary   Relevant Medications   EPINEPHrine 0.3 mg/0.3 mL IJ SOAJ injection   Encounter for weight management    Has had serial titrations of semaglutide, currently on 1 mg weekly x 3 weeks.  Has not noted significant weight loss, some minor early satiety, and most recently minor constipation since being on the 1 mg dose.  Reviewed the nature of the medication, adjunct lifestyle changes to optimize response.  Plan: - Review information below on constipation management - Increase to next semaglutide dose based on compounding pharmacy (target 1.7 mg weekly range) - Increase physical activity and regular basis - Follow-up in 3 months        Orders & Medications Medications:  Meds ordered this encounter  Medications   EPINEPHrine 0.3 mg/0.3 mL IJ SOAJ injection    Sig: Inject 0.3 mg into the muscle once as needed.    Dispense:  1 each    Refill:  0   No orders of the defined types were placed in this encounter.    No follow-ups on file.     Jerrol Banana, MD, [facility]   Primary Care Sports Medicine Primary Care and Sports Medicine at [facility]

## 2022-12-30 ENCOUNTER — Other Ambulatory Visit: Payer: Self-pay

## 2022-12-30 ENCOUNTER — Encounter: Payer: Self-pay | Admitting: Family Medicine

## 2022-12-30 DIAGNOSIS — Z7689 Persons encountering health services in other specified circumstances: Secondary | ICD-10-CM

## 2022-12-30 MED ORDER — SEMAGLUTIDE-WEIGHT MANAGEMENT 1.7 MG/0.75ML ~~LOC~~ SOAJ: 1.7000 mg | SUBCUTANEOUS | 1 refills | Status: DC

## 2022-12-31 ENCOUNTER — Inpatient Hospital Stay (HOSPITAL_COMMUNITY): Admission: RE | Admit: 2022-12-31 | Payer: BC Managed Care – PPO | Source: Ambulatory Visit

## 2022-12-31 DIAGNOSIS — Z1231 Encounter for screening mammogram for malignant neoplasm of breast: Secondary | ICD-10-CM

## 2023-01-08 ENCOUNTER — Telehealth: Payer: Self-pay

## 2023-01-08 NOTE — Telephone Encounter (Signed)
Called pt to confirm upcoming appointment after missed automatic messages. Left message for pt to call and confirm appointment by next day to avoid cancellation of appt.

## 2023-01-09 ENCOUNTER — Encounter: Payer: Self-pay | Admitting: Family Medicine

## 2023-01-09 ENCOUNTER — Ambulatory Visit: Payer: BC Managed Care – PPO | Admitting: Family Medicine

## 2023-01-09 VITALS — BP 113/75 | HR 87 | Ht 68.0 in | Wt 233.0 lb

## 2023-01-09 DIAGNOSIS — D72819 Decreased white blood cell count, unspecified: Secondary | ICD-10-CM | POA: Diagnosis not present

## 2023-01-09 DIAGNOSIS — Z23 Encounter for immunization: Secondary | ICD-10-CM

## 2023-01-09 DIAGNOSIS — E282 Polycystic ovarian syndrome: Secondary | ICD-10-CM | POA: Diagnosis not present

## 2023-01-09 DIAGNOSIS — K08109 Complete loss of teeth, unspecified cause, unspecified class: Secondary | ICD-10-CM

## 2023-01-09 DIAGNOSIS — Z01419 Encounter for gynecological examination (general) (routine) without abnormal findings: Secondary | ICD-10-CM

## 2023-01-09 NOTE — Progress Notes (Signed)
   GYNECOLOGY ANNUAL PREVENTATIVE CARE ENCOUNTER NOTE  Subjective:   Sarah Lowe is a 42 y.o. G49P0010 female here for a routine annual gynecologic exam.  Current complaints: PCOS, establish care .     PCOS: Menses are Regular-- but there is cycle variation from 21-31day cycles> known PCOS since early 20s she reports her cycles were very irregular at that time but have regulated more since 40s. Notes more facial hair recently  Additionally she had noted -- has lost three teeth -- bottom front and molar. No known gum disease and required bone graft. She reports low Vitamin D levels that have never responded to high dose treatment.   Denies abnormal vaginal bleeding, discharge, pelvic pain, problems with intercourse or other gynecologic concerns.    Gynecologic History No LMP recorded. Contraception: none Last Pap: 2022. Results were: normal Last mammogram: needed, ordered by PCP  Health Maintenance Due  Topic Date Due   DTaP/Tdap/Td (1 - Tdap) Never done   COVID-19 Vaccine (4 - 2023-24 season) 11/23/2022    The following portions of the patient's history were reviewed and updated as appropriate: allergies, current medications, past family history, past medical history, past social history, past surgical history and problem list.  Review of Systems Pertinent items are noted in HPI.   Objective:  BP 113/75   Pulse 87   Ht 5\' 8"  (1.727 m)   Wt 233 lb (105.7 kg)   BMI 35.43 kg/m  CONSTITUTIONAL: Well-developed, well-nourished female in no acute distress.  HENT:  Normocephalic, atraumatic, External right and left ear normal. Oropharynx is clear and moist EYES:  No scleral icterus.  NECK: Normal range of motion, supple, no masses.  Normal thyroid.  SKIN: Skin is warm and dry. No rash noted. Not diaphoretic. No erythema. No pallor. NEUROLOGIC: Alert and oriented to person, place, and time. Normal reflexes, muscle tone coordination. No cranial nerve deficit noted. PSYCHIATRIC:  Normal mood and affect. Normal behavior. Normal judgment and thought content. CARDIOVASCULAR: Normal heart rate noted, regular rhythm. 2+ distal pulses. RESPIRATORY: Effort and breath sounds normal, no problems with respiration noted. BREASTS: Symmetric in size. No masses, skin changes, nipple drainage, or lymphadenopathy. ABDOMEN: Soft,  no distention noted.  No tenderness, rebound or guarding.  PELVIC: deferred MUSCULOSKELETAL: Normal range of motion.    Assessment and Plan:  1) Annual gynecologic examination:  Will follow up results of pap smear and manage accordingly. STI screening desired No.  Routine preventative health maintenance measures emphasized. Reviewed perimenopausal symptoms and management.   2) Contraception counseling: not planning pregnancy  1. Need for influenza vaccination - Flu vaccine trivalent PF, 6mos and older(Flulaval,Afluria,Fluarix,Fluzone)  2. Tooth loss Unusual sx for otherwise healthy female. Concern about calcium metabolism - PTH, Intact and Calcium - VITAMIN D 25 Hydroxy (Vit-D Deficiency, Fractures) - TSH + free T4 - Protein Electrophoresis, (serum) - Protein Electrophoresis, Urine Rflx.  3. Leukopenia, unspecified type - PTH, Intact and Calcium - Protein Electrophoresis, (serum) - Protein Electrophoresis, Urine Rflx.  4. PCOS (polycystic ovarian syndrome) Baseline labs, last done > 1 year ago Discussed slynd due to anti androgen. Patient will consider starting after labs.  - Hemoglobin A1c - TSH + free T4 - US PELVIC COMPLETE WITH TRANSVAGINAL; Future - TestT+TestF+SHBG - CBC w/Diff    Please refer to After Visit Summary for other counseling recommendations.   Return in about 3 months (around 04/11/2023).  Federico Flake, MD, MPH, ABFM Attending Physician Center for [facility]

## 2023-01-09 NOTE — Progress Notes (Deleted)
   GYNECOLOGY ANNUAL PREVENTATIVE CARE ENCOUNTER NOTE  Subjective:   Sarah Lowe is a 42 y.o. G26P0010 female here for a routine annual gynecologic exam.  Current complaints: ***.     Menses are {Regular/irregular menstrual period abdominal pain hpi md:30583}  Denies abnormal vaginal bleeding, discharge, pelvic pain, problems with intercourse or other gynecologic concerns.    Gynecologic History No LMP recorded. Contraception: {method:5051} Last Pap: ***. Results were: {norm/abn:16337} Last mammogram: ***. Results were: {norm/abn:16337}  Health Maintenance Due  Topic Date Due   DTaP/Tdap/Td (1 - Tdap) Never done   INFLUENZA VACCINE  10/23/2022   COVID-19 Vaccine (4 - 2023-24 season) 11/23/2022    The following portions of the patient's history were reviewed and updated as appropriate: allergies, current medications, past family history, past medical history, past social history, past surgical history and problem list.  Review of Systems {ros; complete:30496}   Objective:  BP 113/75   Pulse 87   Ht 5\' 8"  (1.727 m)   Wt 233 lb (105.7 kg)   BMI 35.43 kg/m  CONSTITUTIONAL: Well-developed, well-nourished female in no acute distress.  HENT:  Normocephalic, atraumatic, External right and left ear normal. Oropharynx is clear and moist EYES:  No scleral icterus.  NECK: Normal range of motion, supple, no masses.  Normal thyroid.  SKIN: Skin is warm and dry. No rash noted. Not diaphoretic. No erythema. No pallor. NEUROLOGIC: Alert and oriented to person, place, and time. Normal reflexes, muscle tone coordination. No cranial nerve deficit noted. PSYCHIATRIC: Normal mood and affect. Normal behavior. Normal judgment and thought content. CARDIOVASCULAR: Normal heart rate noted, regular rhythm. 2+ distal pulses. RESPIRATORY: Effort and breath sounds normal, no problems with respiration noted. BREASTS: Symmetric in size. No masses, skin changes, nipple drainage, or  lymphadenopathy. ABDOMEN: Soft,  no distention noted.  No tenderness, rebound or guarding.  PELVIC: Normal appearing external genitalia; normal appearing vaginal mucosa and cervix.  No abnormal discharge noted.  ***Pap smear obtained.  Normal uterine size, no other palpable masses, no uterine or adnexal tenderness. Chaperone present for exam MUSCULOSKELETAL: Normal range of motion.   PROCEDURE NOTE: (***if Indicated for IUD, Nexplanon etc)    Assessment and Plan:  1) Annual gynecologic examination ***with pap smear:  Will follow up results of pap smear and manage accordingly. STI screening desired {yes/no:20286}.  Routine preventative health maintenance measures emphasized. Reviewed perimenopausal symptoms and management.   2) Contraception counseling: Reviewed all forms of birth control options available including abstinence; over the counter/barrier methods; hormonal contraceptive medication including pill, patch, ring, injection,contraceptive implant; hormonal and nonhormonal IUDs; permanent sterilization options including vasectomy and the various tubal sterilization modalities. Risks and benefits reviewed.  Questions were answered.  Written information was also given to the patient to review.  Patient desires ***, this was prescribed for patient. She will follow up in  *** for surveillance.  She was told to call with any further questions, or with any concerns about this method of contraception.  Emphasized use of condoms 100% of the time for STI prevention.  1. Need for influenza vaccination *** - Flu vaccine trivalent PF, 6mos and older(Flulaval,Afluria,Fluarix,Fluzone)   Please refer to After Visit Summary for other counseling recommendations.   No follow-ups on file.  Federico Flake, MD, MPH, ABFM Attending Physician Center for [facility]

## 2023-01-09 NOTE — Progress Notes (Signed)
Patient presents for Annual.  LMP: 01/02/23- coming closing together 24-28 days lasting longer  Last pap: Date: 2022-WNL  Only one abnormal pap in her 20's  Contraception: Natural family planning Mammogram:  Ordered, not scheduled yet  STD Screening: Declines Flu Vaccine : Accepts  CC:  PCOS- wants to discuss   Ok with breast exam today, will push off pap to 3 year mark

## 2023-01-15 ENCOUNTER — Ambulatory Visit
Admission: RE | Admit: 2023-01-15 | Discharge: 2023-01-15 | Disposition: A | Discharge status: Home / Self Care | Payer: BC Managed Care – PPO | Source: Ambulatory Visit | Attending: Family Medicine | Admitting: Family Medicine

## 2023-01-15 DIAGNOSIS — E282 Polycystic ovarian syndrome: Secondary | ICD-10-CM | POA: Insufficient documentation

## 2023-01-15 DIAGNOSIS — N946 Dysmenorrhea, unspecified: Secondary | ICD-10-CM | POA: Diagnosis not present

## 2023-01-15 DIAGNOSIS — D259 Leiomyoma of uterus, unspecified: Secondary | ICD-10-CM | POA: Diagnosis not present

## 2023-01-15 DIAGNOSIS — N920 Excessive and frequent menstruation with regular cycle: Secondary | ICD-10-CM | POA: Diagnosis not present

## 2023-01-15 LAB — CBC WITH DIFFERENTIAL/PLATELET
Basophils Absolute: 0 10*3/uL (ref 0.0–0.2)
Basos: 1 %
EOS (ABSOLUTE): 0.1 10*3/uL (ref 0.0–0.4)
Eos: 3 %
Hematocrit: 41.9 % (ref 34.0–46.6)
Hemoglobin: 13.3 g/dL (ref 11.1–15.9)
Immature Grans (Abs): 0 10*3/uL (ref 0.0–0.1)
Immature Granulocytes: 0 %
Lymphocytes Absolute: 1.1 10*3/uL (ref 0.7–3.1)
Lymphs: 34 %
MCH: 28.2 pg (ref 26.6–33.0)
MCHC: 31.7 g/dL (ref 31.5–35.7)
MCV: 89 fL (ref 79–97)
Monocytes Absolute: 0.3 10*3/uL (ref 0.1–0.9)
Monocytes: 11 %
Neutrophils Absolute: 1.6 10*3/uL (ref 1.4–7.0)
Neutrophils: 51 %
Platelets: 328 10*3/uL (ref 150–450)
RBC: 4.72 x10E6/uL (ref 3.77–5.28)
RDW: 13.1 % (ref 11.7–15.4)
WBC: 3.1 10*3/uL — ABNORMAL LOW (ref 3.4–10.8)

## 2023-01-15 LAB — TESTT+TESTF+SHBG
Sex Hormone Binding: 22.4 nmol/L — ABNORMAL LOW (ref 24.6–122.0)
Testosterone, Free: 1.5 pg/mL (ref 0.0–4.2)
Testosterone, Total, LC/MS: 21.1 ng/dL

## 2023-01-15 LAB — VITAMIN D 25 HYDROXY (VIT D DEFICIENCY, FRACTURES): Vit D, 25-Hydroxy: 22.3 ng/mL — ABNORMAL LOW (ref 30.0–100.0)

## 2023-01-15 LAB — PROTEIN ELECTROPHORESIS, SERUM
A/G Ratio: 0.9 (ref 0.7–1.7)
Albumin ELP: 3.4 g/dL (ref 2.9–4.4)
Alpha 1: 0.3 g/dL (ref 0.0–0.4)
Alpha 2: 0.8 g/dL (ref 0.4–1.0)
Beta: 1.2 g/dL (ref 0.7–1.3)
Gamma Globulin: 1.6 g/dL (ref 0.4–1.8)
Globulin, Total: 3.9 g/dL (ref 2.2–3.9)
Total Protein: 7.3 g/dL (ref 6.0–8.5)

## 2023-01-15 LAB — PROTEIN ELECTROPHORESIS, URINE REFLEX
Albumin ELP, Urine: 35.4 %
Alpha-1-Globulin, U: 5.3 %
Alpha-2-Globulin, U: 15.4 %
Beta Globulin, U: 28.4 %
Gamma Globulin, U: 15.5 %
Protein, Ur: 11.5 mg/dL

## 2023-01-15 LAB — PTH, INTACT AND CALCIUM
Calcium: 9 mg/dL (ref 8.7–10.2)
PTH: 44 pg/mL (ref 15–65)

## 2023-01-15 LAB — HEMOGLOBIN A1C
Est. average glucose Bld gHb Est-mCnc: 111 mg/dL
Hgb A1c MFr Bld: 5.5 % (ref 4.8–5.6)

## 2023-01-15 LAB — TSH+FREE T4
Free T4: 1.25 ng/dL (ref 0.82–1.77)
TSH: 2.11 u[IU]/mL (ref 0.450–4.500)

## 2023-01-22 ENCOUNTER — Telehealth: Payer: Self-pay | Admitting: *Deleted

## 2023-01-22 ENCOUNTER — Encounter: Payer: Self-pay | Admitting: Family Medicine

## 2023-01-22 DIAGNOSIS — E559 Vitamin D deficiency, unspecified: Secondary | ICD-10-CM

## 2023-01-22 NOTE — Telephone Encounter (Signed)
Pt left voicemail regarding her lab results and [place] results, requesting a call back or MyChart message

## 2023-01-28 MED ORDER — VITAMIN D (ERGOCALCIFEROL) 1.25 MG (50000 UNIT) PO CAPS
50000.0000 [IU] | ORAL_CAPSULE | ORAL | 0 refills | Status: DC
Start: 2023-01-28 — End: 2023-05-18

## 2023-02-12 ENCOUNTER — Encounter: Payer: BC Managed Care – PPO | Admitting: Family Medicine

## 2023-02-13 ENCOUNTER — Encounter: Payer: Self-pay | Admitting: Family Medicine

## 2023-02-13 ENCOUNTER — Ambulatory Visit (INDEPENDENT_AMBULATORY_CARE_PROVIDER_SITE_OTHER): Payer: BC Managed Care – PPO | Admitting: Family Medicine

## 2023-02-13 VITALS — BP 100/66 | HR 79 | Ht 68.0 in | Wt 234.0 lb

## 2023-02-13 DIAGNOSIS — E282 Polycystic ovarian syndrome: Secondary | ICD-10-CM

## 2023-02-13 DIAGNOSIS — Z0000 Encounter for general adult medical examination without abnormal findings: Secondary | ICD-10-CM | POA: Diagnosis not present

## 2023-02-13 DIAGNOSIS — K08109 Complete loss of teeth, unspecified cause, unspecified class: Secondary | ICD-10-CM | POA: Diagnosis not present

## 2023-02-13 DIAGNOSIS — Z13220 Encounter for screening for lipoid disorders: Secondary | ICD-10-CM

## 2023-02-13 DIAGNOSIS — J309 Allergic rhinitis, unspecified: Secondary | ICD-10-CM

## 2023-02-13 DIAGNOSIS — Z7689 Persons encountering health services in other specified circumstances: Secondary | ICD-10-CM

## 2023-02-13 MED ORDER — QNASL 80 MCG/ACT NA AERS
2.0000 | INHALATION_SPRAY | Freq: Every day | NASAL | 3 refills | Status: DC
Start: 2023-02-13 — End: 2023-05-18

## 2023-02-13 NOTE — Assessment & Plan Note (Signed)
Refilled QNASL.

## 2023-02-13 NOTE — Assessment & Plan Note (Signed)
-   Obtain fasting labs with orders provided (can have water or black coffee but otherwise no food or drink x 8 hours before labs) - Review information provided - Attend eye doctor annually, dentist every 6 months, work towards or maintain 30 minutes of moderate intensity physical activity at least 5 days per week, and consume a balanced diet - Return in 1 year for physical - Contact us for any questions between now and then  General Health Maintenance -Order routine blood work for physical examination. -Schedule follow-up in three months to reassess the patient's conditions and the outcomes of the various referrals and investigations.

## 2023-02-13 NOTE — Assessment & Plan Note (Signed)
Constipation Reported constipation, particularly when on higher dose of semaglutide. Self-discontinued. -Recommend increasing fiber intake, hydration, and physical activity. -Consider bulk-forming laxatives such as Metamucil or Citrucel. -If constipation persists, consider osmotic laxatives like Miralax or Milk of Magnesia.

## 2023-02-13 NOTE — Patient Instructions (Addendum)
-   Obtain fasting labs with orders provided (can have water or black coffee but otherwise no food or drink x 8 hours before labs) - Review information provided - Attend eye doctor annually, dentist every 6 months, work towards or maintain 30 minutes of moderate intensity physical activity at least 5 days per week, and consume a balanced diet - Return in 3 months - Contact us for any questions between now and then   Constipation Stepwise Treatment  Find a consistent over-the-counter regimen that allows for at least 1-2 smooth, formed, bowel movements daily. See steps below and reserve Steps 5 and 6 for times without a bowel movement x 2-3 days despite regular OTC regimen.

## 2023-02-13 NOTE — Progress Notes (Signed)
Annual Physical Exam Visit  Patient Information:  Patient ID: Sarah Lowe, female DOB: Apr 13, 1980 Age: 42 y.o. MRN: [id number]   Subjective:   CC: Annual Physical Exam  HPI:  Sarah Lowe is here for their annual physical.  I reviewed the past medical history, family history, social history, surgical history, and allergies today and changes were made as necessary.  Please see the problem list section below for additional details.  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   PONV (postoperative nausea and vomiting)    Past Surgical History: Past Surgical History:  Procedure Laterality Date   FINGER MASS EXCISION Right 2021   fourth finger   GUM SURGERY  2015   NASAL SEPTOPLASTY W/ TURBINOPLASTY Bilateral 08/15/2021   Procedure: NASAL SEPTOPLASTY WITH INFERIOR TURBINATE REDUCTION;  Surgeon: Vernie Murders, MD;  Location: [facility];  Service: ENT;  Laterality: Bilateral;   NASAL TURBINATE REDUCTION Right 08/15/2021   Procedure: MIDDLE TURBINATE REDUCTION;  Surgeon: Vernie Murders, MD;  Location: [facility];  Service: ENT;  Laterality: Right;   PATELLAR TENDON REPAIR Bilateral 2017   WISDOM TOOTH EXTRACTION Bilateral 2001   Family History: Family History  Problem Relation Age of Onset   Other Mother        Leukopenia   Cancer Mother        TCell Large granular, lymphocytic leukemia   Anemia Mother    Ehlers-Danlos syndrome Mother    Osteoporosis Mother 45   Hypertension Father    Other Father        Agent Orange   Colon cancer Maternal Grandmother    Anuerysm Maternal Grandfather    Heart attack Paternal Grandmother    Stroke Paternal Grandmother    Anuerysm Paternal Grandfather    Allergies: No Known Allergies Health Maintenance: Health Maintenance  Topic  Date Due   DTaP/Tdap/Td (1 - Tdap) Never done   COVID-19 Vaccine (4 - 2023-24 season) 11/23/2022   Cervical Cancer Screening (HPV/Pap Cotest)  01/16/2026   INFLUENZA VACCINE  Completed   Hepatitis C Screening  Completed   HIV Screening  Completed   HPV VACCINES  Aged Out    HM Colonoscopy     This patient has no relevant Health Maintenance data.      Medications: Current Outpatient Medications on File Prior to Visit  Medication Sig Dispense Refill   EPINEPHrine 0.3 mg/0.3 mL IJ SOAJ injection Inject 0.3 mg into the muscle once as needed. 1 each 0   ondansetron (ZOFRAN) 4 MG tablet Take 1 tablet (4 mg total) by mouth every 8 (eight) hours as needed for nausea or vomiting. 20 tablet 0   pantoprazole (PROTONIX) 40 MG tablet Take 1 tablet (40 mg total) by mouth daily. 30 tablet 1   Vitamin D, Ergocalciferol, (DRISDOL) 1.25 MG (50000 UNIT) CAPS capsule Take 1 capsule (50,000 Units total) by mouth every 7 (seven) days. 8 capsule 0   No current facility-administered medications on file prior to visit.    Objective:   Vitals:   02/13/23 0910  BP: 100/66  Pulse: 79  SpO2: 97%   Vitals:   02/13/23 0910  Weight: 234 lb (106.1 kg)  Height: 5\' 8"  (1.727 m)   Body mass index is 35.58 kg/m.  General: Well Developed, well nourished, and in no acute distress.  Neuro: Alert and oriented x3, extra-ocular muscles intact, sensation grossly intact. Cranial nerves II through XII are grossly intact, motor, sensory, and coordinative functions are intact. HEENT: Normocephalic, atraumatic, neck supple, no masses, no lymphadenopathy, thyroid nonenlarged. Oropharynx, nasopharynx, external ear canals are unremarkable. Skin: Warm and dry, no rashes noted.  Cardiac: Regular rate and rhythm, no murmurs rubs or gallops. No peripheral edema. Pulses symmetric. Respiratory: Clear to auscultation bilaterally. Speaking in full sentences.  Abdominal: Soft, nontender, nondistended, positive bowel sounds, no  masses, no organomegaly. Musculoskeletal: Stable, and with full range of motion.   Impression and Recommendations:   The patient was counselled, risk factors were discussed, and anticipatory guidance given.  Problem List Items Addressed This Visit       Respiratory   Allergic rhinitis    Refilled QNASL      Relevant Medications   Beclomethasone Dipropionate (QNASL) 80 MCG/ACT AERS     Endocrine   PCOS (polycystic ovarian syndrome)    Polycystic Ovary Syndrome (PCOS) Patient reports changes in menstrual cycle, facial hair growth, and hair loss. Previous workup showed elevated sex hormone binding globulin serum. Large fibroids detected on ultrasound. -Continue current management and follow-up with the surgeon for the fibroids.        Other   Tooth loss    Multiple Tooth Loss Multiple tooth losses over the past year, with some associated with infection and others with what she reports as spontaneous "crumbling". No reported history of periodontal disease when asked. Patient is currently under dental care and is scheduled for implants. -Referral to a dental specialist at [facility] for further evaluation and management. -Order autoimmune workup to rule out potential autoimmune causes.      Relevant Orders   CBC with Differential/Platelet   Comprehensive metabolic panel   TSH   Alkaline phosphatase, bone specific   ANA 12Plus Profile, Do All RDL   Ambulatory referral to Dentistry   Healthcare maintenance - Primary    - Obtain fasting labs with orders provided (can have water or black coffee but otherwise no food or drink x 8 hours before labs) - Review information provided - Attend eye doctor annually, dentist every 6 months, work towards or maintain 30 minutes of moderate intensity physical activity at least 5 days per week, and consume a balanced diet - Return in 1 year for physical - Contact us for any questions between now and then  General Health Maintenance -Order routine  blood work for physical examination. -Schedule follow-up in three months to reassess the patient's conditions and the outcomes of the various referrals and investigations.      Relevant Orders   CBC with Differential/Platelet   Comprehensive metabolic panel   Hemoglobin A1c   Lipid panel   TSH   Encounter for weight management    Constipation Reported constipation, particularly when on higher dose of semaglutide. Self-discontinued. -Recommend increasing fiber intake, hydration, and physical activity. -Consider bulk-forming laxatives such as Metamucil or Citrucel. -If constipation persists, consider osmotic laxatives like Miralax or Milk of Magnesia.      Other Visit Diagnoses     Screening for lipoid disorders       Relevant Orders   Comprehensive metabolic panel   Lipid panel        Orders & Medications Medications:  Meds ordered this encounter  Medications   Beclomethasone Dipropionate (QNASL) 80 MCG/ACT AERS    Sig: Place 2 sprays into both nostrils  daily.    Dispense:  10.6 g    Refill:  3   Orders Placed This Encounter  Procedures   CBC with Differential/Platelet   Comprehensive metabolic panel   Hemoglobin A1c   Lipid panel   TSH   Alkaline phosphatase, bone specific   ANA 12Plus Profile, Do All RDL   Ambulatory referral to Dentistry     No follow-ups on file.    Jerrol Banana, MD, [facility]   Primary Care Sports Medicine Primary Care and Sports Medicine at [facility]

## 2023-02-13 NOTE — Assessment & Plan Note (Signed)
Multiple Tooth Loss Multiple tooth losses over the past year, with some associated with infection and others with what she reports as spontaneous "crumbling". No reported history of periodontal disease when asked. Patient is currently under dental care and is scheduled for implants. -Referral to a dental specialist at [facility] for further evaluation and management. -Order autoimmune workup to rule out potential autoimmune causes.

## 2023-02-13 NOTE — Assessment & Plan Note (Signed)
Polycystic Ovary Syndrome (PCOS) Patient reports changes in menstrual cycle, facial hair growth, and hair loss. Previous workup showed elevated sex hormone binding globulin serum. Large fibroids detected on ultrasound. -Continue current management and follow-up with the surgeon for the fibroids.

## 2023-02-16 DIAGNOSIS — K08109 Complete loss of teeth, unspecified cause, unspecified class: Secondary | ICD-10-CM | POA: Diagnosis not present

## 2023-02-16 DIAGNOSIS — Z13220 Encounter for screening for lipoid disorders: Secondary | ICD-10-CM | POA: Diagnosis not present

## 2023-02-16 DIAGNOSIS — Z0000 Encounter for general adult medical examination without abnormal findings: Secondary | ICD-10-CM | POA: Diagnosis not present

## 2023-02-17 ENCOUNTER — Other Ambulatory Visit (HOSPITAL_COMMUNITY)
Admission: RE | Admit: 2023-02-17 | Discharge: 2023-02-17 | Disposition: A | Discharge status: Home / Self Care | Payer: BC Managed Care – PPO | Source: Ambulatory Visit | Attending: Obstetrics & Gynecology | Admitting: Obstetrics & Gynecology

## 2023-02-17 ENCOUNTER — Encounter: Payer: Self-pay | Admitting: Obstetrics & Gynecology

## 2023-02-17 ENCOUNTER — Ambulatory Visit: Payer: BC Managed Care – PPO | Admitting: Obstetrics & Gynecology

## 2023-02-17 VITALS — BP 125/76 | HR 86

## 2023-02-17 DIAGNOSIS — N920 Excessive and frequent menstruation with regular cycle: Secondary | ICD-10-CM

## 2023-02-17 DIAGNOSIS — D259 Leiomyoma of uterus, unspecified: Secondary | ICD-10-CM | POA: Diagnosis not present

## 2023-02-17 DIAGNOSIS — D219 Benign neoplasm of connective and other soft tissue, unspecified: Secondary | ICD-10-CM

## 2023-02-17 MED ORDER — NAPROXEN SODIUM 550 MG PO TABS: 550.0000 mg | ORAL_TABLET | Freq: Two times a day (BID) | ORAL | 2 refills | Status: DC

## 2023-02-17 MED ORDER — TRANEXAMIC ACID 650 MG PO TABS: 1300.0000 mg | ORAL_TABLET | Freq: Three times a day (TID) | ORAL | 2 refills | Status: DC

## 2023-02-17 NOTE — Patient Instructions (Addendum)
Lysteda  for five days Naproxen Birth control pills Mirena IUD [employer] vs [employer] procedure

## 2023-02-17 NOTE — Progress Notes (Signed)
GYNECOLOGY OFFICE VISIT NOTE  History:   Sarah Lowe is a 42 y.o. G1P0010 here today for discussion about management of fibroids and associated longer, heavier periods.  Reports having regular periods that last about 6-7 days, characterized by heavy flow and clots. No intermenstrual bleeding.  Had recent ultrasound on 01/15/23 that showed 20 week sized fibroid uterus.  She wants to discuss results and recommendations for management.  She denies any current abnormal vaginal discharge, bleeding, pelvic pain or other concerns.    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   PONV (postoperative nausea and vomiting)     Past Surgical History:  Procedure Laterality Date   FINGER MASS EXCISION Right 2021   fourth finger   GUM SURGERY  2015   NASAL SEPTOPLASTY W/ TURBINOPLASTY Bilateral 08/15/2021   Procedure: NASAL SEPTOPLASTY WITH INFERIOR TURBINATE REDUCTION;  Surgeon: Vernie Murders, MD;  Location: [facility];  Service: ENT;  Laterality: Bilateral;   NASAL TURBINATE REDUCTION Right 08/15/2021   Procedure: MIDDLE TURBINATE REDUCTION;  Surgeon: Vernie Murders, MD;  Location: [facility];  Service: ENT;  Laterality: Right;   PATELLAR TENDON REPAIR Bilateral 2017   WISDOM TOOTH EXTRACTION Bilateral 2001    The following portions of the patient's history were reviewed and updated as appropriate: allergies, current medications, past family history, past medical history, past social history, past surgical history and problem list.   Health Maintenance:  Normal pap and negative HRHPV on 01/16/2021.  Normal mammogram on 07/04/2021.   Review of Systems:  Pertinent items noted in HPI and remainder of comprehensive ROS otherwise negative.  Physical Exam:  BP 125/76   Pulse 86  CONSTITUTIONAL: Well-developed, well-nourished female in no  acute distress.  HEENT:  Normocephalic, atraumatic. External right and left ear normal. No scleral icterus.  NECK: Normal range of motion, supple, no masses noted on observation SKIN: No rash noted. Not diaphoretic. No erythema. No pallor. MUSCULOSKELETAL: Normal range of motion. No edema noted. NEUROLOGIC: Alert and oriented to person, place, and time. Normal muscle tone coordination. No cranial nerve deficit noted. PSYCHIATRIC: Normal mood and affect. Normal behavior. Normal judgment and thought content. CARDIOVASCULAR: Normal heart rate noted RESPIRATORY: Effort and breath sounds normal, no problems with respiration noted ABDOMEN: No masses noted. No other overt distention noted.   PELVIC: Normal appearing external genitalia; normal urethral meatus; normal appearing vaginal mucosa and cervix. No polyp noted or palpated during Pap smear. No abnormal discharge noted.  Performed in the presence of a chaperone.  ENDOMETRIAL BIOPSY     The indications for endometrial biopsy were reviewed.   Risks of the biopsy including cramping, bleeding, infection, uterine perforation, inadequate specimen and need for additional procedures  were discussed. The patient states she understands and agrees to undergo procedure today. Consent was signed. Time out was performed. Urine HCG was negative. Chaperone was present during entire procedure. During the pelvic exam and after the pap smear, the cervix was prepped with Betadine. A single-toothed tenaculum was placed on the anterior lip of the cervix to stabilize it. The 3 mm pipelle was introduced into the endometrial cavity without difficulty to a depth of 17 cm without difficulty, and a moderate amount of tissue was obtained after two passes and sent to pathology. Of note, there was no mass or lesion palpated  in endocervical canal or lower uterine segment as referenced on ultrasound.  The instruments were removed from the patient's vagina. Minimal bleeding from the  cervix was noted. The patient tolerated the procedure well. Routine post-procedure instructions were given to the patient.     Labs and Imaging    Latest Ref Rng & Units 01/09/2023    9:59 AM 02/27/2022    8:47 AM 02/10/2022   10:45 AM  CBC  WBC 3.4 - 10.8 x10E3/uL 3.1  3.4  3.5   Hemoglobin 11.1 - 15.9 g/dL 86.5  78.4  69.6   Hematocrit 34.0 - 46.6 % 41.9  35.2  39.1   Platelets 150 - 450 x10E3/uL 328  262  283    US PELVIC COMPLETE WITH TRANSVAGINAL  Result Date: 01/26/2023 CLINICAL DATA:  PCOS, menorrhagia and dysmenorrhea EXAM: TRANSABDOMINAL AND TRANSVAGINAL ULTRASOUND OF PELVIS TECHNIQUE: Both transabdominal and transvaginal ultrasound examinations of the pelvis were performed. Transabdominal technique was performed for global imaging of the pelvis including uterus, ovaries, adnexal regions, and pelvic cul-de-sac. It was necessary to proceed with endovaginal exam following the transabdominal exam to visualize the uterus, endometrium, ovaries, and adnexa. COMPARISON:  None Available. FINDINGS: Uterus Measurements: 19.4 x 8.2 x 12.6 cm = volume: 1049 mL. Bulky fibroid uterus. Large fibroids, largest at the left aspect of the lower uterine segment measured at 9.7 x 9.1 x 9.5 cm. Endometrium Thickness: 1.3 cm. Endometrium is in general poorly visualized due to obscuring fibroids. Right ovary Measurements: 3.3 x 2.2 x 2.7 cm = volume: 10 mL. Normal appearance/no adnexal mass. Small follicles. Left ovary Measurements: 3.6 x 2.5 x 2.7 cm = volume: 13 mL. Normal appearance/no adnexal mass. Small follicles as well as a dominant follicle measuring 2.0 cm, benign and functional, requiring follow-up or characterization. Other findings No abnormal free fluid. Possible mass, polyp, or clot within the endocervical canal measuring 1.5 x 0.5 x 1.2 cm. IMPRESSION: 1. Bulky fibroid uterus. Large fibroids, largest at the left aspect of the lower uterine segment measured at 9.7 x 9.1 x 9.5 cm. 2. Endometrium is in  general poorly visualized due to obscuring fibroids. Within this limitation, endometrium measures 1.3 cm in thickness. 3. Possible mass, polyp, or clot within the endocervical canal measuring 1.5 x 0.5 x 1.2 cm. Consider MRI, colposcopy, and/or sonohysterogram for further evaluation. 4. Normal sonographic appearance of the ovaries. No specific sonographic findings of polycystic ovarian syndrome. These results will be called to the ordering clinician or representative by the Radiologist Assistant, and communication documented in the PACS or [employer]. Electronically Signed   By: Jearld Lesch M.D.   On: 01/26/2023 11:56   DG Hip Unilat W OR W/O Pelvis 2-3 Views Right  Result Date: 01/19/2023 CLINICAL DATA:  Right hip and groin pain EXAM: DG HIP (WITH OR WITHOUT PELVIS) 2-3V RIGHT COMPARISON:  None Available. FINDINGS: There is no evidence of hip fracture or dislocation. There is no evidence of arthropathy or other focal bone abnormality. IMPRESSION: Negative. Electronically Signed   By: Judie Petit.  Shick M.D.   On: 01/19/2023 08:58       Assessment and Plan:     1. Menorrhagia with regular cycle 2. Fibroids Pap and endometrial biopsy done, will follow up results and manage accordingly. Reviewed ultrasound findings; no cervical lesion seen or palpated during exam, pap and endometrial biopsy. Discussed management options for fibroid-associated menorrhagia including NSAIDs (Naproxen), tranexamic acid (Lysteda), combined estrogen-progesterone pills (birth control pills), oral progesterone, Depo Provera, Levonogestrel IUD (Mirena), MyFembree/Oriahnn.  Also discussed Sonata procedure, uterine fibroid embolization, myomectomy or hysterectomy as surgical management.  Discussed risks and benefits of each method.   Patient is undecided for now.  Printed patient education handouts were given to the patient to review at home about these management options.  Lysteda and Naproxen prescribed  for now,  bleeding  precautions reviewed.   - Cytology - PAP - Endometrial biopsy surgical pathology - naproxen sodium (ANAPROX DS) 550 MG tablet; Take 1 tablet (550 mg total) by mouth 2 (two) times daily with a meal. Take as instructed for your period or as needed.  Dispense: 30 tablet; Refill: 2 - tranexamic acid (LYSTEDA) 650 MG TABS tablet; Take 2 tablets (1,300 mg total) by mouth 3 (three) times daily. Take during menses for a maximum of five days  Dispense: 30 tablet; Refill: 2  Routine preventative health maintenance measures emphasized. Please refer to After Visit Summary for other counseling recommendations.   Return in about 4 weeks (around 03/17/2023) for Follow up visit.    I spent 30 minutes dedicated to the care of this patient including pre-visit review of records, face to face time with the patient discussing her conditions and treatments and post visit orders.    Jaynie Collins, MD, FACOG Obstetrician & Gynecologist, [facility] for [employer], [facility]

## 2023-02-18 LAB — CYTOLOGY - PAP
Comment: NEGATIVE
Diagnosis: NEGATIVE
High risk HPV: NEGATIVE

## 2023-02-19 DIAGNOSIS — J019 Acute sinusitis, unspecified: Secondary | ICD-10-CM | POA: Diagnosis not present

## 2023-02-25 LAB — CBC WITH DIFFERENTIAL/PLATELET
Basophils Absolute: 0 10*3/uL (ref 0.0–0.2)
Basos: 1 %
EOS (ABSOLUTE): 0 10*3/uL (ref 0.0–0.4)
Eos: 1 %
Hematocrit: 39 % (ref 34.0–46.6)
Hemoglobin: 11.9 g/dL (ref 11.1–15.9)
Immature Grans (Abs): 0 10*3/uL (ref 0.0–0.1)
Immature Granulocytes: 0 %
Lymphocytes Absolute: 0.9 10*3/uL (ref 0.7–3.1)
Lymphs: 22 %
MCH: 27.4 pg (ref 26.6–33.0)
MCHC: 30.5 g/dL — ABNORMAL LOW (ref 31.5–35.7)
MCV: 90 fL (ref 79–97)
Monocytes Absolute: 0.4 10*3/uL (ref 0.1–0.9)
Monocytes: 9 %
Neutrophils Absolute: 3 10*3/uL (ref 1.4–7.0)
Neutrophils: 67 %
Platelets: 297 10*3/uL (ref 150–450)
RBC: 4.35 x10E6/uL (ref 3.77–5.28)
RDW: 13.1 % (ref 11.7–15.4)
WBC: 4.4 10*3/uL (ref 3.4–10.8)

## 2023-02-25 LAB — LIPID PANEL
Chol/HDL Ratio: 3.7 {ratio} (ref 0.0–4.4)
Cholesterol, Total: 201 mg/dL — ABNORMAL HIGH (ref 100–199)
HDL: 55 mg/dL (ref >39)
LDL Chol Calc (NIH): 136 mg/dL — ABNORMAL HIGH (ref 0–99)
Triglycerides: 56 mg/dL (ref 0–149)
VLDL Cholesterol Cal: 10 mg/dL (ref 5–40)

## 2023-02-25 LAB — ANA 12PLUS PROFILE, DO ALL RDL
Anti-CCP Ab, IgG & IgA (RDL): <20 U (ref <20)
Anti-Cardiolipin Ab, IgA (RDL): <12 [APL'U]/mL (ref <12)
Anti-Cardiolipin Ab, IgG (RDL): <15 [GPL'U]/mL (ref <15)
Anti-Cardiolipin Ab, IgM (RDL): <13 [MPL'U]/mL (ref <13)
Anti-Centromere Ab (RDL): <1:40 {titer}
Anti-Chromatin Ab, IgG (RDL): <20 U (ref <20)
Anti-La (SS-B) Ab (RDL): <20 U (ref <20)
Anti-Nuclear Ab by IFA (RDL): POSITIVE — AB
Anti-Ro (SS-A) Ab (RDL): <20 U (ref <20)
Anti-Scl-70 Ab (RDL): <20 U (ref <20)
Anti-Sm Ab (RDL): <20 U (ref <20)
Anti-TPO Ab (RDL): <9 [IU]/mL (ref <9.0)
Anti-U1 RNP Ab (RDL): <20 U (ref <20)
Anti-dsDNA Ab by Farr(RDL): <8 [IU]/mL (ref <8.0)
C3 Complement (RDL): 139 mg/dL (ref 82–167)
C4 Complement (RDL): 48 mg/dL — ABNORMAL HIGH (ref 14–44)
Rheumatoid Factor by Turb RDL: <14 [IU]/mL (ref <14)

## 2023-02-25 LAB — COMPREHENSIVE METABOLIC PANEL
ALT: 11 [IU]/L (ref 0–32)
AST: 16 [IU]/L (ref 0–40)
Albumin: 3.9 g/dL (ref 3.9–4.9)
Alkaline Phosphatase: 74 [IU]/L (ref 44–121)
BUN/Creatinine Ratio: 9 (ref 9–23)
BUN: 9 mg/dL (ref 6–24)
Bilirubin Total: <0.2 mg/dL (ref 0.0–1.2)
CO2: 20 mmol/L (ref 20–29)
Calcium: 9.1 mg/dL (ref 8.7–10.2)
Chloride: 103 mmol/L (ref 96–106)
Creatinine, Ser: 0.97 mg/dL (ref 0.57–1.00)
Globulin, Total: 3 g/dL (ref 1.5–4.5)
Glucose: 84 mg/dL (ref 70–99)
Potassium: 4.1 mmol/L (ref 3.5–5.2)
Sodium: 139 mmol/L (ref 134–144)
Total Protein: 6.9 g/dL (ref 6.0–8.5)
eGFR: 75 mL/min/{1.73_m2} (ref >59)

## 2023-02-25 LAB — ALKALINE PHOSPHATASE, BONE SPECIFIC: Tandem-R Ostase: 9.3 ug/L

## 2023-02-25 LAB — HEMOGLOBIN A1C
Est. average glucose Bld gHb Est-mCnc: 114 mg/dL
Hgb A1c MFr Bld: 5.6 % (ref 4.8–5.6)

## 2023-02-25 LAB — ANA TITER AND PATTERN: Speckled Pattern: 1:80 {titer} — ABNORMAL HIGH

## 2023-02-25 LAB — TSH: TSH: 1.59 u[IU]/mL (ref 0.450–4.500)

## 2023-03-05 LAB — SURGICAL PATHOLOGY

## 2023-03-09 ENCOUNTER — Telehealth: Payer: Self-pay | Admitting: *Deleted

## 2023-03-09 NOTE — Telephone Encounter (Signed)
-----   Message from [place] Any[place] sent at 03/05/2023  1:32 PM EST ----- Benign endometrial biopsy, please call patient and inform her of results.

## 2023-03-09 NOTE — Telephone Encounter (Signed)
Called pt and informed her of benign biopsy results

## 2023-03-10 ENCOUNTER — Ambulatory Visit: Payer: BC Managed Care – PPO | Admitting: Obstetrics & Gynecology

## 2023-03-24 DIAGNOSIS — D251 Intramural leiomyoma of uterus: Secondary | ICD-10-CM | POA: Diagnosis not present

## 2023-04-02 ENCOUNTER — Ambulatory Visit: Payer: BC Managed Care – PPO | Admitting: Obstetrics & Gynecology

## 2023-04-20 DIAGNOSIS — Z131 Encounter for screening for diabetes mellitus: Secondary | ICD-10-CM | POA: Diagnosis not present

## 2023-04-20 DIAGNOSIS — Z1329 Encounter for screening for other suspected endocrine disorder: Secondary | ICD-10-CM | POA: Diagnosis not present

## 2023-04-20 DIAGNOSIS — E781 Pure hyperglyceridemia: Secondary | ICD-10-CM | POA: Diagnosis not present

## 2023-04-20 DIAGNOSIS — E559 Vitamin D deficiency, unspecified: Secondary | ICD-10-CM | POA: Diagnosis not present

## 2023-04-20 DIAGNOSIS — N951 Menopausal and female climacteric states: Secondary | ICD-10-CM | POA: Diagnosis not present

## 2023-04-20 DIAGNOSIS — Z130 Encounter for screening for diseases of the blood and blood-forming organs and certain disorders involving the immune mechanism: Secondary | ICD-10-CM | POA: Diagnosis not present

## 2023-04-22 DIAGNOSIS — E282 Polycystic ovarian syndrome: Secondary | ICD-10-CM | POA: Diagnosis not present

## 2023-04-22 DIAGNOSIS — N951 Menopausal and female climacteric states: Secondary | ICD-10-CM | POA: Diagnosis not present

## 2023-04-22 DIAGNOSIS — M7711 Lateral epicondylitis, right elbow: Secondary | ICD-10-CM | POA: Diagnosis not present

## 2023-04-22 DIAGNOSIS — R232 Flushing: Secondary | ICD-10-CM | POA: Diagnosis not present

## 2023-04-22 DIAGNOSIS — N898 Other specified noninflammatory disorders of vagina: Secondary | ICD-10-CM | POA: Diagnosis not present

## 2023-05-18 ENCOUNTER — Ambulatory Visit: Payer: BLUE CROSS/BLUE SHIELD | Admitting: Family Medicine

## 2023-05-18 ENCOUNTER — Telehealth: Payer: Self-pay | Admitting: Family Medicine

## 2023-05-18 ENCOUNTER — Encounter: Payer: Self-pay | Admitting: Family Medicine

## 2023-05-18 ENCOUNTER — Other Ambulatory Visit (INDEPENDENT_AMBULATORY_CARE_PROVIDER_SITE_OTHER): Payer: Self-pay | Admitting: Radiology

## 2023-05-18 VITALS — BP 120/74 | HR 80 | Ht 68.0 in | Wt 239.2 lb

## 2023-05-18 DIAGNOSIS — M174 Other bilateral secondary osteoarthritis of knee: Secondary | ICD-10-CM | POA: Diagnosis not present

## 2023-05-18 DIAGNOSIS — K08109 Complete loss of teeth, unspecified cause, unspecified class: Secondary | ICD-10-CM

## 2023-05-18 DIAGNOSIS — D259 Leiomyoma of uterus, unspecified: Secondary | ICD-10-CM

## 2023-05-18 DIAGNOSIS — J309 Allergic rhinitis, unspecified: Secondary | ICD-10-CM | POA: Diagnosis not present

## 2023-05-18 DIAGNOSIS — Z7689 Persons encountering health services in other specified circumstances: Secondary | ICD-10-CM

## 2023-05-18 MED ORDER — TRIAMCINOLONE ACETONIDE 40 MG/ML IJ SUSP
40.0000 mg | Freq: Once | INTRAMUSCULAR | Status: AC
Start: 2023-05-18 — End: 2023-05-18
  Administered 2023-05-18: 40 mg via INTRAMUSCULAR

## 2023-05-18 MED ORDER — QNASL 80 MCG/ACT NA AERS: 2.0000 | INHALATION_SPRAY | Freq: Every day | NASAL | 3 refills | Status: DC

## 2023-05-18 NOTE — Assessment & Plan Note (Addendum)
 She has been experiencing knee instability, leading to falls, including a recent incident while putting away dishes. Her knees 'give out' unexpectedly, and she is concerned about worsening knee issues post-surgery. She has previously responded well to cortisone injections, which provided relief for about three months. A PRP injection in June 2023 lasted more than six months. She is considering further treatment options, including gel injections, and has had prior imaging including MRIs and x-rays.  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  This is in the setting of known patella alta and degenerative changes noted on imaging.  Currently undergoing physical therapy focusing on pelvic floor and knee stabilization. -Administer bilateral cortisone injections under ultrasound guidance today to alleviate pain and potentially improve knee stability. -Submit authorization for hyaluronic acid gel injection as a potential longer-term solution. -Can contact us if wanting to pursue platelet rich plasma injection(s) in the future.

## 2023-05-18 NOTE — Assessment & Plan Note (Signed)
 Patient was able to see [facility] for second opinion.  She has dental issues related to prior surgery and extractions, which, per dentist, have altered the pressure on her teeth. She has two front teeth implants and is awaiting further dental work, including a bite guard to address clenching issues.  Dental Health Patient reports loose teeth likely due to clenching. Plan for bite guard once current dental work is completed. -Continue with current dental treatment plan and implement use of bite guard when appropriate.

## 2023-05-18 NOTE — Patient Instructions (Addendum)
 You have just been given a cortisone injection to reduce pain and inflammation. After the injection you may notice immediate relief of pain as a result of the Lidocaine. It is important to rest the area of the injection for 24 to 48 hours after the injection. There is a possibility of some temporary increased discomfort and swelling for up to 72 hours until the cortisone begins to work. If you do have pain, simply rest the joint and use ice. If you can tolerate over the counter medications, you can try Tylenol, Aleve, or Advil for added relief per package instructions.  Allergic Rhinitis  1. Continue using Beclomethasone Dipropionate (QNASL) as prescribed.  Weight Management  1. Consider trial of Tirzepatide for weight loss.  Contact us regarding which vendor you would like to proceed with. 2. Anticipate potential need for a bowel regimen to manage constipation.  Bilateral Knee Osteoarthritis  1. Administer bilateral cortisone injections under ultrasound guidance today. 2. Submit authorization for hyaluronic acid gel injection as a potential longer-term solution. 3. Contact us if interested in pursuing platelet-rich plasma injections in the future.  Dental Health  1. Continue with the current dental treatment plan. 2. Implement the use of a bite guard once current dental work is completed.  Uterine Fibroids  1. Continue taking Naproxen Sodium 500mg  twice daily during the menstrual cycle. 2. Consider taking medication with food to reduce potential stomach irritation.

## 2023-05-18 NOTE — Telephone Encounter (Signed)
 Completed.

## 2023-05-18 NOTE — Assessment & Plan Note (Signed)
 Weight Management Patient is actively trying to lose weight. Previous trial of Ozempic did not result in significant weight loss and caused constipation despite maximum titration. -Consider trial of Tirzepatide for weight loss, with anticipation of potential need for a bowel regimen to manage potential constipation side effect.

## 2023-05-18 NOTE — Progress Notes (Signed)
 Primary Care / Sports Medicine Office Visit  Patient Information:  Patient ID: Sarah Lowe, female DOB: April 28, 1980 Age: 43 y.o. MRN: [id number]   Sarah Lowe is a pleasant 43 y.o. female presenting with the following:  Chief Complaint  Patient presents with   Weight Management Screening    Patient presents today to discuss weight management options.     Vitals:   05/18/23 1022  BP: 120/74  Pulse: 80  SpO2: 98%   Vitals:   05/18/23 1022  Weight: 239 lb 3.2 oz (108.5 kg)  Height: 5\' 8"  (1.727 m)   Body mass index is 36.37 kg/m.  No results found.   Independent interpretation of notes and tests performed by another provider:   None  Procedures performed:   Procedure:  Injection of right knee under ultrasound guidance. Ultrasound guidance utilized for anteromedial approach, no abnormalities noted Samsung HS60 device utilized with permanent recording / reporting. Verbal informed consent obtained and verified. Skin prepped in a sterile fashion. Ethyl chloride for topical local analgesia.  Completed without difficulty and tolerated well. Medication: triamcinolone acetonide 40 mg/mL suspension for injection 1 mL total and 2 mL lidocaine 1% without epinephrine utilized for needle placement anesthetic Advised to contact for fevers/chills, erythema, induration, drainage, or persistent bleeding.  Procedure:  Injection of left knee under ultrasound guidance. Ultrasound guidance utilized for intra-articular anterolateral approach, no effusion Samsung HS60 device utilized with permanent recording / reporting. Verbal informed consent obtained and verified. Skin prepped in a sterile fashion. Ethyl chloride for topical local analgesia.  Completed without difficulty and tolerated well. Medication: triamcinolone acetonide 40 mg/mL suspension for injection 1 mL total and 2 mL lidocaine 1% without epinephrine utilized for needle placement anesthetic Advised to  contact for fevers/chills, erythema, induration, drainage, or persistent bleeding.   Pertinent History, Exam, Impression, and Recommendations:   Problem List Items Addressed This Visit     Allergic rhinitis   Relevant Medications   Beclomethasone Dipropionate (QNASL) 80 MCG/ACT AERS   Encounter for weight management   Weight Management Patient is actively trying to lose weight. Previous trial of Ozempic did not result in significant weight loss and caused constipation despite maximum titration. -Consider trial of Tirzepatide for weight loss, with anticipation of potential need for a bowel regimen to manage potential constipation side effect.      Other bilateral secondary osteoarthritis of knee - Primary   She has been experiencing knee instability, leading to falls, including a recent incident while putting away dishes. Her knees 'give out' unexpectedly, and she is concerned about worsening knee issues post-surgery. She has previously responded well to cortisone injections, which provided relief for about three months. A PRP injection in June 2023 lasted more than six months. She is considering further treatment options, including gel injections, and has had prior imaging including MRIs and x-rays.  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  This is in the setting of known patella alta and degenerative changes noted on imaging.  Currently undergoing physical therapy focusing on pelvic floor and knee stabilization. -Administer bilateral cortisone injections under ultrasound guidance today to alleviate pain and potentially improve knee stability. -Submit authorization for hyaluronic acid gel injection as a potential longer-term solution. -Can contact us if wanting to pursue platelet rich plasma injection(s) in  the future.  Tooth loss   Patient was able to see [facility] for second opinion.  She has dental issues related to prior surgery and extractions, which, per dentist, have altered the pressure on her teeth. She has two front teeth implants and is awaiting further dental work, including a bite guard to address clenching issues.  Dental Health Patient reports loose teeth likely due to clenching. Plan for bite guard once current dental work is completed. -Continue with current dental treatment plan and implement use of bite guard when appropriate.      Uterine leiomyoma   She has been diagnosed with prominent uterine fibroids, recently evaluated by [facility] gynecology who recommended surgical removal. However, she has opted to delay surgery to focus on weight loss and strengthening due to concerns about her knees giving out, which has led to falls, including one last month. See additional assessment(s) for plan details. She is currently managing symptoms with naproxen sodium, 500 mg, taken twice daily during her menstrual period, which has reduced her bleeding duration from seven to four days. She is not taking the prescribed tranexamic acid given her positive response from. She experiences occasional vomiting during her menstrual cycle, which she attributes to her usual symptoms rather than the medication.  Uterine Fibroids Patient has opted to delay surgical intervention due to concerns about knee instability and potential post-operative immobility. Currently managing symptoms with Naproxen Sodium 500mg  twice daily during menstrual cycle, which has reduced bleeding duration and volume. -Continue Naproxen Sodium 500mg  twice daily during menstrual cycle. -Consider adding food intake with medication to buffer potential stomach irritation.        Orders & Medications Medications:  Meds ordered this encounter  Medications   Beclomethasone Dipropionate (QNASL) 80 MCG/ACT AERS    Sig: Place 2 sprays into  both nostrils daily.    Dispense:  10.6 g    Refill:  3   triamcinolone acetonide (KENALOG-40) injection 40 mg   Orders Placed This Encounter  Procedures   [place] LIMITED JOINT SPACE STRUCTURES LOW BILAT     No follow-ups on file.     Jerrol Banana, MD, [facility]   Primary Care Sports Medicine Primary Care and Sports Medicine at [facility]

## 2023-05-18 NOTE — Assessment & Plan Note (Signed)
 She has been diagnosed with prominent uterine fibroids, recently evaluated by [facility] gynecology who recommended surgical removal. However, she has opted to delay surgery to focus on weight loss and strengthening due to concerns about her knees giving out, which has led to falls, including one last month. See additional assessment(s) for plan details. She is currently managing symptoms with naproxen sodium, 500 mg, taken twice daily during her menstrual period, which has reduced her bleeding duration from seven to four days. She is not taking the prescribed tranexamic acid given her positive response from. She experiences occasional vomiting during her menstrual cycle, which she attributes to her usual symptoms rather than the medication.  Uterine Fibroids Patient has opted to delay surgical intervention due to concerns about knee instability and potential post-operative immobility. Currently managing symptoms with Naproxen Sodium 500mg  twice daily during menstrual cycle, which has reduced bleeding duration and volume. -Continue Naproxen Sodium 500mg  twice daily during menstrual cycle. -Consider adding food intake with medication to buffer potential stomach irritation.

## 2023-05-19 DIAGNOSIS — M7711 Lateral epicondylitis, right elbow: Secondary | ICD-10-CM | POA: Diagnosis not present

## 2023-05-20 DIAGNOSIS — M7711 Lateral epicondylitis, right elbow: Secondary | ICD-10-CM | POA: Diagnosis not present

## 2023-05-25 ENCOUNTER — Encounter: Payer: Self-pay | Admitting: Family Medicine

## 2023-05-25 ENCOUNTER — Ambulatory Visit (INDEPENDENT_AMBULATORY_CARE_PROVIDER_SITE_OTHER): Admitting: Family Medicine

## 2023-05-25 VITALS — BP 128/78 | HR 88 | Ht 68.0 in | Wt 240.0 lb

## 2023-05-25 DIAGNOSIS — Q682 Congenital deformity of knee: Secondary | ICD-10-CM

## 2023-05-25 DIAGNOSIS — M174 Other bilateral secondary osteoarthritis of knee: Secondary | ICD-10-CM | POA: Diagnosis not present

## 2023-05-25 DIAGNOSIS — F411 Generalized anxiety disorder: Secondary | ICD-10-CM | POA: Diagnosis not present

## 2023-05-25 DIAGNOSIS — F339 Major depressive disorder, recurrent, unspecified: Secondary | ICD-10-CM | POA: Diagnosis not present

## 2023-05-25 DIAGNOSIS — F909 Attention-deficit hyperactivity disorder, unspecified type: Secondary | ICD-10-CM | POA: Diagnosis not present

## 2023-05-25 DIAGNOSIS — M222X2 Patellofemoral disorders, left knee: Secondary | ICD-10-CM

## 2023-05-25 MED ORDER — DULOXETINE HCL 30 MG PO CPEP: ORAL_CAPSULE | ORAL | 0 refills | Status: DC

## 2023-05-25 NOTE — Progress Notes (Signed)
 Primary Care / Sports Medicine Office Visit  Patient Information:  Patient ID: Sarah Lowe, female DOB: Jun 07, 1980 Age: 43 y.o. MRN: [id number]   Sarah Lowe is a pleasant 43 y.o. female presenting with the following:  Chief Complaint  Patient presents with   Anxiety    Last couple of weeks patient has been under lots of stress. Patient does not have a way to fix what is going on. Lots of different things in personal life and work is very demanding now.     Vitals:   05/25/23 1110  BP: 128/78  Pulse: 88  SpO2: 96%   Vitals:   05/25/23 1110  Weight: 240 lb (108.9 kg)  Height: 5\' 8"  (1.727 m)   Body mass index is 36.49 kg/m.     Independent interpretation of notes and tests performed by another provider:   RADIOLOGY Knee MRI: Status post patellar tendon repair with intact graft, signal abnormalities and thickening of the graft likely due to degeneration and possibly interstitial tearing, patellar cartilage abnormalities with high-grade full-thickness component (07/29/2021)  Procedures performed:   None  Pertinent History, Exam, Impression, and Recommendations:   Problem List Items Addressed This Visit     Attention deficit hyperactivity disorder (ADHD)   She is not currently taking any medications for ADHD, as previous medications made her feel 'horrible'. She recalls being on Strattera, which helped but had adverse effects. She is not currently under the care of a psychiatrist for ADHD.      Relevant Orders   Ambulatory referral to Psychiatry   Episode of recurrent major depressive disorder (HCC) - Primary   She reports significant stress and anxiety since 2020, exacerbated by personal and professional challenges. She describes a hectic lifestyle with continuous work since 2020, including a period where she worked two jobs simultaneously. Financial strains and a recent hurtful letter from her mother-in-law contribute to her stress. She feels  overwhelmed and unable to concentrate on personal health due to work pressures, including a fear of job loss due to [employer]. She recalls being prescribed Wellbutrin after her father's death, which initially helped but later increased her anxiety when tried again during the 2020s. She has not been on any other medications for stress since then.  Anxiety and Depression Elevated scores consistent with actively symptomatic anxiety and depression. Prior use of Wellbutrin for depression. -Start duloxetine (Cymbalta) 30 mg x 1 week then increase to 60 mg as it may also help with her known chronic knee pain. -Schedule virtual follow-up visit in six weeks to assess response to medication.      Relevant Medications   DULoxetine (CYMBALTA) 30 MG capsule   Other Relevant Orders   Ambulatory referral to Psychiatry   GAD (generalized anxiety disorder)   See additional assessment(s) for plan details.      Relevant Medications   DULoxetine (CYMBALTA) 30 MG capsule   Other Relevant Orders   Ambulatory referral to Psychiatry   Other bilateral secondary osteoarthritis of knee   Relevant Orders   MR Knee Left  Wo Contrast   Patella alta   Relevant Orders   MR Knee Left  Wo Contrast   Patellofemoral syndrome, left   In the setting of patella alta status post patellar tendon repair. She experiences knee instability, particularly when walking and looking to the side, leading to falls. The knee gives out, especially when distracted, such as when looking to the side. An MRI in May 2023 showed signal abnormalities  and thickening of the graft from a previous patellar tendon repair, as well as patellar cartilage abnormalities. She received an injection that alleviated pain fully but did not address the frequency of falls. She has a history of patellar tendon repair surgery performed in [place] and has been experiencing knee issues since then. The knee instability is not associated with pain.  Knee  Instability Recurrent falls due to knee instability despite pain relief from injections. Prior patellar tendon repair with graft showing signs of degeneration and possible interstitial tearing on MRI from May 2023. -Order new MRI to assess current state of graft and evaluate internal structures. -Refer to orthopedic surgery for further evaluation and management options. -Advise use of hinged knee brace for interim stability and injury prevention.      Relevant Orders   MR Knee Left  Wo Contrast     Orders & Medications Medications:  Meds ordered this encounter  Medications   DULoxetine (CYMBALTA) 30 MG capsule    Sig: Take 1 capsule (30 mg total) by mouth every evening for 7 days, THEN 2 capsules (60 mg total) every evening.    Dispense:  80 capsule    Refill:  0   Orders Placed This Encounter  Procedures   MR Knee Left  Wo Contrast   Ambulatory referral to Psychiatry     Return in about 6 weeks (around 07/06/2023) for virtual visit.     Jerrol Banana, MD, [facility]   Primary Care Sports Medicine Primary Care and Sports Medicine at [facility]

## 2023-05-25 NOTE — Patient Instructions (Signed)
 1. ADHD, Anxiety, and Depression:    - Start taking duloxetine (Cymbalta) 30 mg daily for one week, then increase to 60 mg.    - Schedule a virtual follow-up visit in six weeks to assess the response to medication.    - Follow up with a psychiatry referral for further management of ADHD and anxiety/depression.  2. Knee Instability:    - Schedule an MRI of the left knee without contrast to assess the current state of the graft and internal structures.    - Follow up with your previous orthopedic surgery group for further evaluation and potential management options.    - Use a hinged knee brace for added stability and to prevent falls.  Red Flags: - Seek immediate medical attention if experiencing increased swelling, redness, warmth, or pain in the knee, or changes in knee stability leading to frequent falls.

## 2023-05-25 NOTE — Assessment & Plan Note (Signed)
 She reports significant stress and anxiety since 2020, exacerbated by personal and professional challenges. She describes a hectic lifestyle with continuous work since 2020, including a period where she worked two jobs simultaneously. Financial strains and a recent hurtful letter from her mother-in-law contribute to her stress. She feels overwhelmed and unable to concentrate on personal health due to work pressures, including a fear of job loss due to [employer]. She recalls being prescribed Wellbutrin after her father's death, which initially helped but later increased her anxiety when tried again during the 2020s. She has not been on any other medications for stress since then.  Anxiety and Depression Elevated scores consistent with actively symptomatic anxiety and depression. Prior use of Wellbutrin for depression. -Start duloxetine (Cymbalta) 30 mg x 1 week then increase to 60 mg as it may also help with her known chronic knee pain. -Schedule virtual follow-up visit in six weeks to assess response to medication.

## 2023-05-25 NOTE — Assessment & Plan Note (Signed)
 She is not currently taking any medications for ADHD, as previous medications made her feel 'horrible'. She recalls being on Strattera, which helped but had adverse effects. She is not currently under the care of a psychiatrist for ADHD.

## 2023-05-25 NOTE — Assessment & Plan Note (Signed)
 See additional assessment(s) for plan details.

## 2023-05-25 NOTE — Assessment & Plan Note (Signed)
 In the setting of patella alta status post patellar tendon repair. She experiences knee instability, particularly when walking and looking to the side, leading to falls. The knee gives out, especially when distracted, such as when looking to the side. An MRI in May 2023 showed signal abnormalities and thickening of the graft from a previous patellar tendon repair, as well as patellar cartilage abnormalities. She received an injection that alleviated pain fully but did not address the frequency of falls. She has a history of patellar tendon repair surgery performed in [place] and has been experiencing knee issues since then. The knee instability is not associated with pain.  Knee Instability Recurrent falls due to knee instability despite pain relief from injections. Prior patellar tendon repair with graft showing signs of degeneration and possible interstitial tearing on MRI from May 2023. -Order new MRI to assess current state of graft and evaluate internal structures. -Refer to orthopedic surgery for further evaluation and management options. -Advise use of hinged knee brace for interim stability and injury prevention.

## 2023-05-30 ENCOUNTER — Ambulatory Visit
Admission: RE | Admit: 2023-05-30 | Discharge: 2023-05-30 | Disposition: A | Discharge status: Home / Self Care | Source: Ambulatory Visit | Attending: Family Medicine

## 2023-05-30 DIAGNOSIS — M174 Other bilateral secondary osteoarthritis of knee: Secondary | ICD-10-CM | POA: Insufficient documentation

## 2023-05-30 DIAGNOSIS — Q682 Congenital deformity of knee: Secondary | ICD-10-CM | POA: Insufficient documentation

## 2023-05-30 DIAGNOSIS — M25462 Effusion, left knee: Secondary | ICD-10-CM | POA: Diagnosis not present

## 2023-05-30 DIAGNOSIS — M222X2 Patellofemoral disorders, left knee: Secondary | ICD-10-CM | POA: Diagnosis not present

## 2023-05-30 DIAGNOSIS — M25562 Pain in left knee: Secondary | ICD-10-CM | POA: Diagnosis not present

## 2023-06-09 ENCOUNTER — Encounter: Payer: Self-pay | Admitting: Family Medicine

## 2023-06-16 DIAGNOSIS — M7711 Lateral epicondylitis, right elbow: Secondary | ICD-10-CM | POA: Diagnosis not present

## 2023-06-18 DIAGNOSIS — M7711 Lateral epicondylitis, right elbow: Secondary | ICD-10-CM | POA: Diagnosis not present

## 2023-06-23 DIAGNOSIS — M25562 Pain in left knee: Secondary | ICD-10-CM | POA: Diagnosis not present

## 2023-06-23 DIAGNOSIS — M76899 Other specified enthesopathies of unspecified lower limb, excluding foot: Secondary | ICD-10-CM | POA: Diagnosis not present

## 2023-06-23 DIAGNOSIS — G8929 Other chronic pain: Secondary | ICD-10-CM | POA: Diagnosis not present

## 2023-06-30 DIAGNOSIS — M25562 Pain in left knee: Secondary | ICD-10-CM | POA: Diagnosis not present

## 2023-06-30 DIAGNOSIS — M76899 Other specified enthesopathies of unspecified lower limb, excluding foot: Secondary | ICD-10-CM | POA: Diagnosis not present

## 2023-06-30 DIAGNOSIS — M25561 Pain in right knee: Secondary | ICD-10-CM | POA: Diagnosis not present

## 2023-06-30 DIAGNOSIS — G8929 Other chronic pain: Secondary | ICD-10-CM | POA: Diagnosis not present

## 2023-07-06 ENCOUNTER — Telehealth: Admitting: Family Medicine

## 2023-07-09 DIAGNOSIS — H40023 Open angle with borderline findings, high risk, bilateral: Secondary | ICD-10-CM | POA: Diagnosis not present

## 2023-07-09 DIAGNOSIS — H04123 Dry eye syndrome of bilateral lacrimal glands: Secondary | ICD-10-CM | POA: Diagnosis not present

## 2023-07-21 DIAGNOSIS — F902 Attention-deficit hyperactivity disorder, combined type: Secondary | ICD-10-CM | POA: Diagnosis not present

## 2023-07-23 ENCOUNTER — Ambulatory Visit: Admitting: Family Medicine

## 2023-07-23 ENCOUNTER — Encounter: Payer: Self-pay | Admitting: Family Medicine

## 2023-07-23 VITALS — BP 110/70 | HR 78 | Ht 68.0 in | Wt 236.8 lb

## 2023-07-23 DIAGNOSIS — F339 Major depressive disorder, recurrent, unspecified: Secondary | ICD-10-CM

## 2023-07-23 DIAGNOSIS — D259 Leiomyoma of uterus, unspecified: Secondary | ICD-10-CM | POA: Diagnosis not present

## 2023-07-23 DIAGNOSIS — M222X2 Patellofemoral disorders, left knee: Secondary | ICD-10-CM

## 2023-07-23 NOTE — Assessment & Plan Note (Signed)
 Uterine Fibroids Large fibroids, 11 cm and 9 cm. MRI obtained despite claustrophobia. Decision between hysterectomy and myomectomy pending. Upcoming [facility] appointment to discuss surgical options.

## 2023-07-23 NOTE — Progress Notes (Signed)
     Primary Care / Sports Medicine Office Visit  Patient Information:  Patient ID: Sarah Lowe, female DOB: 09/27/1980 Age: 43 y.o. MRN: [id number]   Sarah Lowe is a pleasant 43 y.o. female presenting with the following:  Chief Complaint  Patient presents with   Depression    Patient presents today for update on her depression. She is not taking the Cymbalta  anymore. She seen Psych and they have started to treat her ADHD with Adderall 5 mg daily. This has only been for  a few days. She is feeling like things are moving in the right direction for her.    Vitals:   07/23/23 0836  BP: 110/70  Pulse: 78  SpO2: 97%   Vitals:   07/23/23 0836  Weight: 236 lb 12.8 oz (107.4 kg)  Height: 5\' 8"  (1.727 m)   Body mass index is 36.01 kg/m.  No results found.   Independent interpretation of notes and tests performed by another provider:   None  Procedures performed:   None  Pertinent History, Exam, Impression, and Recommendations:   Problem List Items Addressed This Visit     Episode of recurrent major depressive disorder (HCC)   Depression / Anxiety GAD symptoms improved, GAD-7 score reduced from 20 to 9, depression symptoms improved, PHQ-9 score reduced from 14 to 8. Has been diagnosed with ADHD and receiving treatment which is most likely accounting for interim improvement. Cymbalta  ineffective. Discussed potential medication adjustments if symptoms persist. - Continue to remain off of Cymbalta . - Maintain routine follow up with psychiatry.      Patellofemoral syndrome, left - Primary   She is undergoing physical therapy for her knees and is strengthening her quadriceps at [facility] in [place]. Insurance issues are affecting her access to gel injections for her knees, as they are not covered out of state.   Knee Pain Knee pain persists, managed with physical therapy. Insurance issues delayed gel injections. Cortisone injections considered if pain severe.  Dry needling beneficial for scar tissue. - Continue physical therapy at [facility] in [place]. - Contact insurance to resolve gel injection issues. - Consider cortisone injections if pain severe. - Use oral anti-inflammatories as needed. - Continue dry needling for scar tissue.      Uterine leiomyoma   Uterine Fibroids Large fibroids, 11 cm and 9 cm. MRI obtained despite claustrophobia. Decision between hysterectomy and myomectomy pending. Upcoming [facility] appointment to discuss surgical options.        Orders & Medications Medications: No orders of the defined types were placed in this encounter.  No orders of the defined types were placed in this encounter.    Return in about 7 months (around 02/14/2024) for CPE.     Ma Saupe, MD, [facility]   Primary Care Sports Medicine Primary Care and Sports Medicine at [facility]

## 2023-07-23 NOTE — Assessment & Plan Note (Signed)
 She is undergoing physical therapy for her knees and is strengthening her quadriceps at [facility] in [place]. Insurance issues are affecting her access to gel injections for her knees, as they are not covered out of state.   Knee Pain Knee pain persists, managed with physical therapy. Insurance issues delayed gel injections. Cortisone injections considered if pain severe. Dry needling beneficial for scar tissue. - Continue physical therapy at [facility] in [place]. - Contact insurance to resolve gel injection issues. - Consider cortisone injections if pain severe. - Use oral anti-inflammatories as needed. - Continue dry needling for scar tissue.

## 2023-07-23 NOTE — Assessment & Plan Note (Signed)
 Depression / Anxiety GAD symptoms improved, GAD-7 score reduced from 20 to 9, depression symptoms improved, PHQ-9 score reduced from 14 to 8. Has been diagnosed with ADHD and receiving treatment which is most likely accounting for interim improvement. Cymbalta  ineffective. Discussed potential medication adjustments if symptoms persist. - Continue to remain off of Cymbalta . - Maintain routine follow up with psychiatry.

## 2023-07-23 NOTE — Patient Instructions (Signed)
 Patient Plan  Depression / Anxiety: 1. Stay off Cymbalta  as previously advised. 2. Continue routine follow-up appointments with your psychiatrist.  Knee Pain (Patellofemoral Syndrome, Left): 1. Continue physical therapy sessions at [facility] in [place]. 2. Contact your insurance provider to resolve issues regarding coverage for gel injections. 3. Consider cortisone injections if the pain becomes severe. 4. Use oral anti-inflammatory medications as needed for pain relief. 5. Continue dry needling treatments for scar tissue management.  Uterine Fibroids: 1. Await your upcoming appointment at [facility] to discuss surgical options between hysterectomy and myomectomy.  Red Flags: - If you experience any significant changes in your symptoms or new concerns, please contact your healthcare provider immediately.

## 2023-07-25 DIAGNOSIS — F4320 Adjustment disorder, unspecified: Secondary | ICD-10-CM | POA: Diagnosis not present

## 2023-08-01 DIAGNOSIS — F4320 Adjustment disorder, unspecified: Secondary | ICD-10-CM | POA: Diagnosis not present

## 2023-08-03 ENCOUNTER — Ambulatory Visit: Admitting: Family Medicine

## 2023-08-03 DIAGNOSIS — M25561 Pain in right knee: Secondary | ICD-10-CM | POA: Diagnosis not present

## 2023-08-03 DIAGNOSIS — G8929 Other chronic pain: Secondary | ICD-10-CM | POA: Diagnosis not present

## 2023-08-03 DIAGNOSIS — M25562 Pain in left knee: Secondary | ICD-10-CM | POA: Diagnosis not present

## 2023-08-03 DIAGNOSIS — M76899 Other specified enthesopathies of unspecified lower limb, excluding foot: Secondary | ICD-10-CM | POA: Diagnosis not present

## 2023-08-07 DIAGNOSIS — H40023 Open angle with borderline findings, high risk, bilateral: Secondary | ICD-10-CM | POA: Diagnosis not present

## 2023-08-10 DIAGNOSIS — D259 Leiomyoma of uterus, unspecified: Secondary | ICD-10-CM | POA: Diagnosis not present

## 2023-08-10 DIAGNOSIS — Z1331 Encounter for screening for depression: Secondary | ICD-10-CM | POA: Diagnosis not present

## 2023-08-10 DIAGNOSIS — H40023 Open angle with borderline findings, high risk, bilateral: Secondary | ICD-10-CM | POA: Diagnosis not present

## 2023-08-15 DIAGNOSIS — F4320 Adjustment disorder, unspecified: Secondary | ICD-10-CM | POA: Diagnosis not present

## 2023-08-18 DIAGNOSIS — M25562 Pain in left knee: Secondary | ICD-10-CM | POA: Diagnosis not present

## 2023-08-18 DIAGNOSIS — M76899 Other specified enthesopathies of unspecified lower limb, excluding foot: Secondary | ICD-10-CM | POA: Diagnosis not present

## 2023-08-18 DIAGNOSIS — G8929 Other chronic pain: Secondary | ICD-10-CM | POA: Diagnosis not present

## 2023-08-18 DIAGNOSIS — M25561 Pain in right knee: Secondary | ICD-10-CM | POA: Diagnosis not present

## 2023-08-20 DIAGNOSIS — F902 Attention-deficit hyperactivity disorder, combined type: Secondary | ICD-10-CM | POA: Diagnosis not present

## 2023-08-24 ENCOUNTER — Ambulatory Visit: Admitting: Family Medicine

## 2023-08-24 ENCOUNTER — Encounter: Payer: Self-pay | Admitting: Family Medicine

## 2023-08-24 VITALS — BP 94/62 | HR 76 | Ht 68.0 in | Wt 227.4 lb

## 2023-08-24 DIAGNOSIS — J309 Allergic rhinitis, unspecified: Secondary | ICD-10-CM | POA: Diagnosis not present

## 2023-08-24 DIAGNOSIS — F339 Major depressive disorder, recurrent, unspecified: Secondary | ICD-10-CM

## 2023-08-24 DIAGNOSIS — D259 Leiomyoma of uterus, unspecified: Secondary | ICD-10-CM

## 2023-08-24 DIAGNOSIS — F411 Generalized anxiety disorder: Secondary | ICD-10-CM | POA: Diagnosis not present

## 2023-08-24 DIAGNOSIS — F4322 Adjustment disorder with anxiety: Secondary | ICD-10-CM | POA: Diagnosis not present

## 2023-08-24 MED ORDER — QNASL 80 MCG/ACT NA AERS: 2.0000 | INHALATION_SPRAY | Freq: Every day | NASAL | 3 refills | Status: DC

## 2023-08-24 MED ORDER — AZELASTINE HCL 0.1 % NA SOLN: 2.0000 | Freq: Two times a day (BID) | NASAL | 1 refills | Status: DC

## 2023-08-25 ENCOUNTER — Encounter: Payer: Self-pay | Admitting: Family Medicine

## 2023-08-25 ENCOUNTER — Ambulatory Visit
Admission: EM | Admit: 2023-08-25 | Discharge: 2023-08-25 | Disposition: A | Discharge status: Home / Self Care | Attending: Emergency Medicine | Admitting: Emergency Medicine

## 2023-08-25 DIAGNOSIS — T7840XA Allergy, unspecified, initial encounter: Secondary | ICD-10-CM | POA: Diagnosis not present

## 2023-08-25 MED ORDER — CETIRIZINE HCL 10 MG PO TABS: 10.0000 mg | ORAL_TABLET | Freq: Two times a day (BID) | ORAL | 0 refills | Status: AC

## 2023-08-25 MED ORDER — PREDNISONE 10 MG (21) PO TBPK
ORAL_TABLET | ORAL | 0 refills | Status: DC
Start: 2023-08-25 — End: 2024-02-15

## 2023-08-25 NOTE — ED Triage Notes (Signed)
 Patient states that she's been having sx x 3 days  Itching on face and tongue.

## 2023-08-25 NOTE — ED Provider Notes (Signed)
 HPI  SUBJECTIVE:  Sarah Lowe is a 43 y.o. female who presents with several days of her nose itching.  She reports the itching is now in her tongue, lips, roof of the mouth, ears and scalp starting today.  She reports itchy eyes.  She has had similar symptoms before when she was stung by an unknown insect.  She received a steroid shot with resolution in her symptoms.  No nasal congestion, rhinorrhea, watery eyes, tongue or lip swelling, sensation of throat swelling shut, voice changes, wheezing or shortness of breath, nausea, vomiting, diarrhea, Donnell pain, lightheadedness, syncope, urticaria, rash, flushing.  She reports a cough because of the itching.  No new lotions, soaps, detergents, foods, recent antibiotics, new cosmetics, exposure to smoke or perfumes.  She has tried Claritin, Qnasl  steroid spray.  The Claritin helps.  No aggravating factors.  She has a past medical history of GERD, allergic rhinitis, food and environmental allergies, eczema, recurrent sinusitis status post septoplasty/turbinate reduction, uterine fibroids.  LMP: Last week.  Denies the possibility being pregnant.  PCP: [facility]  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   PONV (postoperative nausea and vomiting)     Past Surgical History:  Procedure Laterality Date   FINGER MASS EXCISION Right 2021   fourth finger   GUM SURGERY  2015   NASAL SEPTOPLASTY W/ TURBINOPLASTY Bilateral 08/15/2021   Procedure: NASAL SEPTOPLASTY WITH INFERIOR TURBINATE REDUCTION;  Surgeon: Mellody Sprout, MD;  Location: [facility];  Service: ENT;  Laterality: Bilateral;   NASAL TURBINATE REDUCTION Right 08/15/2021   Procedure: MIDDLE TURBINATE REDUCTION;  Surgeon: Mellody Sprout, MD;  Location: [facility];  Service: ENT;  Laterality: Right;   PATELLAR TENDON REPAIR  Bilateral 2017   WISDOM TOOTH EXTRACTION Bilateral 2001    Family History  Problem Relation Age of Onset   Other Mother        Leukopenia   Cancer Mother        TCell Large granular, lymphocytic leukemia   Anemia Mother    Ehlers-Danlos syndrome Mother    Osteoporosis Mother 39   Hypertension Father    Other Father        Agent Orange   Colon cancer Maternal Grandmother    Anuerysm Maternal Grandfather    Heart attack Paternal Grandmother    Stroke Paternal Grandmother    Anuerysm Paternal Grandfather     Social History   Tobacco Use   Smoking status: Never   Smokeless tobacco: Never  Vaping Use   Vaping status: Never Used  Substance Use Topics   Alcohol use: Yes    Alcohol/week: 3.0 standard drinks of alcohol    Types: 3 Standard drinks or equivalent per week   Drug use: Never    No current facility-administered medications for this encounter.  Current Outpatient Medications:    amphetamine-dextroamphetamine (ADDERALL XR) 5 MG 24 hr capsule, Take 5 mg by mouth daily., Disp: , Rfl:    Beclomethasone Dipropionate  (QNASL ) 80 MCG/ACT AERS, Place 2 sprays into both nostrils daily., Disp: 10.6 g, Rfl: 3   metFORMIN (GLUCOPHAGE-XR) 500 MG 24 hr tablet, Take 500 mg by mouth daily with breakfast., Disp: , Rfl:    spironolactone (ALDACTONE) 50 MG tablet, Take 50 mg by mouth daily., Disp: , Rfl:    azelastine (ASTELIN)  0.1 % nasal spray, Place 2 sprays into both nostrils 2 (two) times daily. Use in each nostril as directed, Disp: 30 mL, Rfl: 1   EPINEPHrine  0.3 mg/0.3 mL IJ SOAJ injection, Inject 0.3 mg into the muscle once as needed., Disp: 1 each, Rfl: 0   naproxen  sodium (ANAPROX  DS) 550 MG tablet, Take 1 tablet (550 mg total) by mouth 2 (two) times daily with a meal. Take as instructed for your period or as needed., Disp: 30 tablet, Rfl: 2   ondansetron  (ZOFRAN ) 4 MG tablet, Take 1 tablet (4 mg total) by mouth every 8 (eight) hours as needed for nausea or vomiting. (Patient  not taking: Reported on 08/24/2023), Disp: 20 tablet, Rfl: 0   Vitamin D , Ergocalciferol , (DRISDOL ) 1.25 MG (50000 UNIT) CAPS capsule, Take 50,000 Units by mouth every 7 (seven) days., Disp: , Rfl:   No Known Allergies   ROS  As noted in HPI.   Physical Exam  BP 136/89 (BP Location: Right Arm)   Pulse (!) 53   Temp 98.1 F (36.7 C) (Oral)   Resp 16   SpO2 99%   Constitutional: Well developed, well nourished, no acute distress Eyes:  EOMI, conjunctiva normal bilaterally HENT: Normocephalic, atraumatic,mucus membranes moist.  No angioedema of the lips or tongue.  Airway widely patent.  Normal voice.  No drooling, stridor.  Mild nasal congestion.   Respiratory: Normal inspiratory effort, lungs clear bilaterally. Cardiovascular: Normal rate, regular rhythm, no murmurs rubs or gallops GI: nondistended skin: No rash, flushing, urticaria over torso, extremities, face Musculoskeletal: no deformities Neurologic: Alert & oriented x 3, no focal neuro deficits Psychiatric: Speech and behavior appropriate   ED Course   Medications - No data to display  No orders of the defined types were placed in this encounter.   No results found for this or any previous visit (from the past 24 hours). No results found.  ED Clinical Impression  1. Allergic reaction, initial encounter      ED Assessment/Plan     I suspect that the patient is allergic to an ingredient that she has recently ingested.  There is no evidence of anaphylaxis.  Will send home with a 6-day prednisone  taper,  Zyrtec 10 mg twice a day.  May titrate up to 20 mg twice a day.  States she does not need another EpiPen .  Follow-up with PCP or with allergist if symptoms are not improving.  She is to go to the ER for any signs of anaphylaxis.  Discussed MDM, treatment plan, and plan for follow-up with patient. Discussed sn/sx that should prompt return to the ED. patient agrees with plan.   No orders of the defined types were  placed in this encounter.     *This clinic note was created using Dragon dictation software. Therefore, there may be occasional mistakes despite careful proofreading.  ?    Ethlyn Herd, MD 08/26/23 1538

## 2023-08-25 NOTE — Assessment & Plan Note (Signed)
 She has a history of recurrent major depressive disorder and generalized anxiety disorder. Currently, she takes Adderall, which aids concentration. Initially, she took 5 mg in the morning and 5 mg in the afternoon, but now takes the full 10 mg dose in the morning, improving her symptoms. She experiences stress related to work and personal life, which she discusses with her psychiatrist. She recently started marriage counseling and individual therapy to manage stress and anxiety. Riding horses is therapeutic, though stress increased when one horse was recently ill.  Recurrent major depressive disorder Managed with Adderall, aiding concentration. Symptoms improved but stress and anxiety persist due to medical issues and disability paperwork. Psychiatrist optimizing Adderall dosage before addressing anxiety. - Continue current Adderall regimen as prescribed by psychiatrist. - Coordinate with psychiatrist to ensure alignment on ICD codes for disability paperwork. - Encourage ongoing therapy and marriage counseling to support mental health.

## 2023-08-25 NOTE — Discharge Instructions (Signed)
 Finish the prednisone , even if you feel better.  Try the Zyrtec 10 mg twice a day.  May titrate up to 20 mg twice a day if necessary.  Taper the Zyrtec down when you are feeling better.  Continue your steroid nasal spray.  Use your EpiPen  and go immediately to the emergency department for extensive hives, tongue or lip swelling, sensation of throat swelling shut, voice changes, wheezing, shortness of breath, diarrhea, abdominal pain, passing out.

## 2023-08-25 NOTE — Telephone Encounter (Signed)
 Please review and advise.   JM

## 2023-08-25 NOTE — Progress Notes (Signed)
 Primary Care / Sports Medicine Office Visit  Patient Information:  Patient ID: Sarah Lowe, female DOB: 1981-02-16 Age: 43 y.o. MRN: [id number]   Sarah Lowe is a pleasant 43 y.o. female presenting with the following:  Chief Complaint  Patient presents with   Depression    Patient needs FMLA paper work filled out.   Allergic Rhinitis     Patient having itching of her right nostril x 1 week. Patient has ben taking Claritin but it does not seem to help.    Vitals:   08/24/23 0932  BP: 94/62  Pulse: 76  SpO2: 98%   Vitals:   08/24/23 0932  Weight: 227 lb 6.4 oz (103.1 kg)  Height: 5\' 8"  (1.727 m)   Body mass index is 34.58 kg/m.  No results found.   Independent interpretation of notes and tests performed by another provider:   None  Procedures performed:   None  Pertinent History, Exam, Impression, and Recommendations:   Problem List Items Addressed This Visit     Allergic rhinitis   She experiences persistent nasal itching and burning, primarily in the right nostril, described as a 'burning itch' without associated sneezing or significant nasal discharge. Symptoms worsen with frequent nose blowing, occasionally leading to minor epistaxis. No fever, chills, facial pressure, or ear discomfort.  Allergic rhinitis Itching and burning in right nostril, bilateral turbinate erythema and swelling, small abrasion in right nostril. Symptoms likely allergic, no infection signs. - Prescribe Qnasl  nasal spray to reduce swelling and itching. - Prescribe Astelin (intranasal antihistamine) to manage allergy symptoms. - Advise use of a humidifier or saline spray to prevent dryness.      Relevant Medications   azelastine (ASTELIN) 0.1 % nasal spray   Beclomethasone Dipropionate  (QNASL ) 80 MCG/ACT AERS   Episode of recurrent major depressive disorder (HCC)   She has a history of recurrent major depressive disorder and generalized anxiety disorder. Currently,  she takes Adderall, which aids concentration. Initially, she took 5 mg in the morning and 5 mg in the afternoon, but now takes the full 10 mg dose in the morning, improving her symptoms. She experiences stress related to work and personal life, which she discusses with her psychiatrist. She recently started marriage counseling and individual therapy to manage stress and anxiety. Riding horses is therapeutic, though stress increased when one horse was recently ill.  Recurrent major depressive disorder Managed with Adderall, aiding concentration. Symptoms improved but stress and anxiety persist due to medical issues and disability paperwork. Psychiatrist optimizing Adderall dosage before addressing anxiety. - Continue current Adderall regimen as prescribed by psychiatrist. - Coordinate with psychiatrist to ensure alignment on ICD codes for disability paperwork. - Encourage ongoing therapy and marriage counseling to support mental health.      GAD (generalized anxiety disorder)   Generalized anxiety disorder Anxiety related to medical issues and disability paperwork. Psychiatrist focusing on Adderall dosage optimization while addressing anxiety. Engaged in marriage counseling and therapy. - Consider as-needed medications for anxiety if recommended by psychiatrist. - Encourage ongoing therapy and marriage counseling to support mental health.      Uterine leiomyoma - Primary   Uterine fibroids Fibroids increased by 2 cm since November, located high necessitating open surgery. Potential hysterectomy due to size and location. Discussed hemorrhage and transfusion risks. Family planning options considered due to fertility impact. - Coordinate with GYN surgeons to schedule surgery for fibroid removal.        Orders & Medications Medications:  Meds  ordered this encounter  Medications   azelastine (ASTELIN) 0.1 % nasal spray    Sig: Place 2 sprays into both nostrils 2 (two) times daily. Use in each  nostril as directed    Dispense:  30 mL    Refill:  1    Use generic Astelin   Beclomethasone Dipropionate  (QNASL ) 80 MCG/ACT AERS    Sig: Place 2 sprays into both nostrils daily.    Dispense:  10.6 g    Refill:  3   No orders of the defined types were placed in this encounter.    No follow-ups on file.     Ma Saupe, MD, [facility]   Primary Care Sports Medicine Primary Care and Sports Medicine at [facility]

## 2023-08-25 NOTE — Assessment & Plan Note (Addendum)
 She experiences persistent nasal itching and burning, primarily in the right nostril, described as a 'burning itch' without associated sneezing or significant nasal discharge. Symptoms worsen with frequent nose blowing, occasionally leading to minor epistaxis. No fever, chills, facial pressure, or ear discomfort.  Allergic rhinitis Itching and burning in right nostril, bilateral turbinate erythema and swelling, small abrasion in right nostril. Symptoms likely allergic, no infection signs. - Prescribe Qnasl  nasal spray to reduce swelling and itching. - Prescribe Astelin (intranasal antihistamine) to manage allergy symptoms. - Advise use of a humidifier or saline spray to prevent dryness.

## 2023-08-25 NOTE — Assessment & Plan Note (Signed)
 Generalized anxiety disorder Anxiety related to medical issues and disability paperwork. Psychiatrist focusing on Adderall dosage optimization while addressing anxiety. Engaged in marriage counseling and therapy. - Consider as-needed medications for anxiety if recommended by psychiatrist. - Encourage ongoing therapy and marriage counseling to support mental health.

## 2023-08-25 NOTE — Patient Instructions (Signed)
 Patient Action Plan  1. Allergic Rhinitis:    - Use Qnasl  nasal spray to reduce swelling and itching.    - Use Astelin nasal spray to manage allergy symptoms.    - Use a humidifier or saline spray to prevent nasal dryness.  2. Recurrent Major Depressive Disorder and Generalized Anxiety Disorder:    - Continue taking Adderall as prescribed by your psychiatrist.    - Work with your psychiatrist to ensure proper ICD codes are used for disability paperwork.    - Continue attending therapy and marriage counseling sessions for mental health support.    - Discuss with your psychiatrist the possibility of as-needed medications for anxiety.  3. Uterine Leiomyoma (Fibroids):    - Coordinate with your gynecologist to schedule surgery for fibroid removal.  Red Flags: Contact your healthcare provider if you experience severe nasal bleeding, significant changes in mood or anxiety levels, or any new or worsening symptoms related to your fibroids, such as severe abdominal pain or heavy bleeding.

## 2023-08-25 NOTE — Assessment & Plan Note (Signed)
 Uterine fibroids Fibroids increased by 2 cm since November, located high necessitating open surgery. Potential hysterectomy due to size and location. Discussed hemorrhage and transfusion risks. Family planning options considered due to fertility impact. - Coordinate with GYN surgeons to schedule surgery for fibroid removal.

## 2023-08-26 NOTE — Telephone Encounter (Signed)
 Please review and advise.   JM

## 2023-08-28 DIAGNOSIS — R0789 Other chest pain: Secondary | ICD-10-CM | POA: Diagnosis not present

## 2023-08-30 DIAGNOSIS — F4320 Adjustment disorder, unspecified: Secondary | ICD-10-CM | POA: Diagnosis not present

## 2023-08-31 NOTE — Telephone Encounter (Signed)
 Sent provider message to review messages.

## 2023-09-01 DIAGNOSIS — F330 Major depressive disorder, recurrent, mild: Secondary | ICD-10-CM | POA: Diagnosis not present

## 2023-09-01 DIAGNOSIS — R41840 Attention and concentration deficit: Secondary | ICD-10-CM | POA: Diagnosis not present

## 2023-09-01 DIAGNOSIS — F411 Generalized anxiety disorder: Secondary | ICD-10-CM | POA: Diagnosis not present

## 2023-09-02 ENCOUNTER — Ambulatory Visit

## 2023-09-02 DIAGNOSIS — R748 Abnormal levels of other serum enzymes: Secondary | ICD-10-CM | POA: Diagnosis not present

## 2023-09-03 DIAGNOSIS — F4322 Adjustment disorder with anxiety: Secondary | ICD-10-CM | POA: Diagnosis not present

## 2023-09-05 DIAGNOSIS — F4320 Adjustment disorder, unspecified: Secondary | ICD-10-CM | POA: Diagnosis not present

## 2023-09-06 ENCOUNTER — Other Ambulatory Visit: Payer: Self-pay | Admitting: Medical Genetics

## 2023-09-07 ENCOUNTER — Other Ambulatory Visit
Admission: RE | Admit: 2023-09-07 | Discharge: 2023-09-07 | Disposition: A | Discharge status: Home / Self Care | Source: Ambulatory Visit | Attending: Medical Genetics | Admitting: Medical Genetics

## 2023-09-10 DIAGNOSIS — F4322 Adjustment disorder with anxiety: Secondary | ICD-10-CM | POA: Diagnosis not present

## 2023-09-10 DIAGNOSIS — D251 Intramural leiomyoma of uterus: Secondary | ICD-10-CM | POA: Diagnosis not present

## 2023-09-14 DIAGNOSIS — M25562 Pain in left knee: Secondary | ICD-10-CM | POA: Diagnosis not present

## 2023-09-14 DIAGNOSIS — M25561 Pain in right knee: Secondary | ICD-10-CM | POA: Diagnosis not present

## 2023-09-14 DIAGNOSIS — G8929 Other chronic pain: Secondary | ICD-10-CM | POA: Diagnosis not present

## 2023-09-14 DIAGNOSIS — M76899 Other specified enthesopathies of unspecified lower limb, excluding foot: Secondary | ICD-10-CM | POA: Diagnosis not present

## 2023-09-15 LAB — GENECONNECT MOLECULAR SCREEN: Genetic Analysis Overall Interpretation: NEGATIVE

## 2023-09-16 DIAGNOSIS — F902 Attention-deficit hyperactivity disorder, combined type: Secondary | ICD-10-CM | POA: Diagnosis not present

## 2023-09-17 ENCOUNTER — Encounter: Admitting: Obstetrics & Gynecology

## 2023-09-17 DIAGNOSIS — F330 Major depressive disorder, recurrent, mild: Secondary | ICD-10-CM | POA: Diagnosis not present

## 2023-09-17 DIAGNOSIS — R41840 Attention and concentration deficit: Secondary | ICD-10-CM | POA: Diagnosis not present

## 2023-09-17 DIAGNOSIS — F411 Generalized anxiety disorder: Secondary | ICD-10-CM | POA: Diagnosis not present

## 2023-09-18 DIAGNOSIS — F4322 Adjustment disorder with anxiety: Secondary | ICD-10-CM | POA: Diagnosis not present

## 2023-09-20 DIAGNOSIS — F4320 Adjustment disorder, unspecified: Secondary | ICD-10-CM | POA: Diagnosis not present

## 2023-09-22 DIAGNOSIS — H40003 Preglaucoma, unspecified, bilateral: Secondary | ICD-10-CM | POA: Diagnosis not present

## 2023-09-22 DIAGNOSIS — H04123 Dry eye syndrome of bilateral lacrimal glands: Secondary | ICD-10-CM | POA: Diagnosis not present

## 2023-09-22 DIAGNOSIS — H527 Unspecified disorder of refraction: Secondary | ICD-10-CM | POA: Diagnosis not present

## 2023-09-24 DIAGNOSIS — F4322 Adjustment disorder with anxiety: Secondary | ICD-10-CM | POA: Diagnosis not present

## 2023-10-01 DIAGNOSIS — F4322 Adjustment disorder with anxiety: Secondary | ICD-10-CM | POA: Diagnosis not present

## 2023-10-02 DIAGNOSIS — F330 Major depressive disorder, recurrent, mild: Secondary | ICD-10-CM | POA: Diagnosis not present

## 2023-10-02 DIAGNOSIS — R41840 Attention and concentration deficit: Secondary | ICD-10-CM | POA: Diagnosis not present

## 2023-10-02 DIAGNOSIS — F411 Generalized anxiety disorder: Secondary | ICD-10-CM | POA: Diagnosis not present

## 2023-10-05 ENCOUNTER — Encounter: Payer: Self-pay | Admitting: Obstetrics & Gynecology

## 2023-10-05 ENCOUNTER — Ambulatory Visit: Admitting: Obstetrics & Gynecology

## 2023-10-05 VITALS — BP 116/75 | HR 82 | Wt 229.0 lb

## 2023-10-05 DIAGNOSIS — N946 Dysmenorrhea, unspecified: Secondary | ICD-10-CM | POA: Diagnosis not present

## 2023-10-05 DIAGNOSIS — D219 Benign neoplasm of connective and other soft tissue, unspecified: Secondary | ICD-10-CM

## 2023-10-05 DIAGNOSIS — N920 Excessive and frequent menstruation with regular cycle: Secondary | ICD-10-CM

## 2023-10-05 NOTE — Progress Notes (Signed)
 Follow up appointment: fibroids  Chief Complaint  Patient presents with   Fibroids    Blood pressure 116/75, pulse 82, weight 229 lb (103.9 kg), last menstrual period 09/10/2023.  No specific report of pelvic MRI but patient showed me her images on her phone and appears to be 2 dominant fibroids maybe others 12 cm and 7 cm  Pt has decided she wants myomectomy and not hysterectomy  Discussed in detail robot assisted approach which I feel is the best option  All questions were answered  MEDS ordered this encounter: No orders of the defined types were placed in this encounter.   Orders for this encounter: No orders of the defined types were placed in this encounter.   Impression + Management Plan   ICD-10-CM   1. Fibroids  D21.9     2. Menorrhagia with regular cycle  N92.0     3. Dysmenorrhea  N94.6       Follow Up: Return for pt will send mychart message with her decision regarding surgical intervention.     All questions were answered.  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   PONV (postoperative nausea and vomiting)     Past Surgical History:  Procedure Laterality Date   FINGER MASS EXCISION Right 2021   fourth finger   GUM SURGERY  2015   NASAL SEPTOPLASTY W/ TURBINOPLASTY Bilateral 08/15/2021   Procedure: NASAL SEPTOPLASTY WITH INFERIOR TURBINATE REDUCTION;  Surgeon: Edda Mt, MD;  Location: [facility];  Service: ENT;  Laterality: Bilateral;   NASAL TURBINATE REDUCTION Right 08/15/2021   Procedure: MIDDLE TURBINATE REDUCTION;  Surgeon: Edda Mt, MD;  Location: [facility];  Service: ENT;  Laterality: Right;   PATELLAR TENDON REPAIR Bilateral 2017   WISDOM TOOTH EXTRACTION Bilateral 2001    OB History     Gravida  1   Para      Term      Preterm      AB  1   Living          SAB  1   IAB      Ectopic      Multiple      Live Births              No Known Allergies  Social History   Socioeconomic History   Marital status: Married    Spouse name: Lamar Rocks   Number of children: 0   Years of education: 16   Highest education level: Bachelor's degree (e.g., BA, AB, BS)  Occupational History   Not on file  Tobacco Use   Smoking status: Never   Smokeless tobacco: Never  Vaping Use   Vaping status: Never Used  Substance and Sexual Activity   Alcohol use: Yes    Alcohol/week: 3.0 standard drinks of alcohol    Types: 3 Standard drinks or equivalent per week   Drug use: Never   Sexual activity: Yes    Partners: Male    Birth control/protection: None  Other Topics Concern   Not on file  Social History Narrative   Not on file   Social Drivers of Health   Financial Resource Strain: Low Risk  (08/06/2023)   Received from [facility]   Overall Financial Resource Strain (CARDIA)    Difficulty of Paying Living  Expenses: Not hard at all  Food Insecurity: No Food Insecurity (08/06/2023)   Received from [facility]   Hunger Vital Sign    Within the past 12 months, you worried that your food would run out before you got the money to buy more.: Never true    Within the past 12 months, the food you bought just didn't last and you didn't have money to get more.: Never true  Transportation Needs: No Transportation Needs (08/06/2023)   Received from [facility] - Transportation    Lack of Transportation (Medical): No    Lack of Transportation (Non-Medical): No  Physical Activity: Sufficiently Active (08/06/2023)   Received from [facility]   Exercise Vital Sign    On average, how many days per week do you engage in moderate to strenuous exercise (like a brisk walk)?: 5 days    On average, how many minutes do you engage in exercise at this level?: 150+ min  Recent Concern: Physical Activity - Insufficiently Active (05/29/2023)    Received from [facility]   Exercise Vital Sign    On average, how many days per week do you engage in moderate to strenuous exercise (like a brisk walk)?: 3 days    On average, how many minutes do you engage in exercise at this level?: 40 min  Stress: Stress Concern Present (08/06/2023)   Received from [facility] - Occupational Stress Questionnaire    Feeling of Stress : To some extent  Social Connections: Socially Integrated (08/06/2023)   Received from [facility]   Social Network    How would you rate your social network (family, work, friends)?: Good participation with social networks    Family History  Problem Relation Age of Onset   Other Mother        Leukopenia   Cancer Mother        TCell Large granular, lymphocytic leukemia   Anemia Mother    Ehlers-Danlos syndrome Mother    Osteoporosis Mother 58   Hypertension Father    Other Father        Agent Orange   Colon cancer Maternal Grandmother    Anuerysm Maternal Grandfather    Heart attack Paternal Grandmother    Stroke Paternal Grandmother    Anuerysm Paternal Actor

## 2023-10-06 DIAGNOSIS — F4322 Adjustment disorder with anxiety: Secondary | ICD-10-CM | POA: Diagnosis not present

## 2023-10-12 DIAGNOSIS — R5382 Chronic fatigue, unspecified: Secondary | ICD-10-CM | POA: Diagnosis not present

## 2023-10-12 DIAGNOSIS — Z8659 Personal history of other mental and behavioral disorders: Secondary | ICD-10-CM | POA: Diagnosis not present

## 2023-10-12 DIAGNOSIS — G4719 Other hypersomnia: Secondary | ICD-10-CM | POA: Diagnosis not present

## 2023-10-12 DIAGNOSIS — M357 Hypermobility syndrome: Secondary | ICD-10-CM | POA: Diagnosis not present

## 2023-10-15 DIAGNOSIS — N841 Polyp of cervix uteri: Secondary | ICD-10-CM | POA: Diagnosis not present

## 2023-10-15 DIAGNOSIS — D259 Leiomyoma of uterus, unspecified: Secondary | ICD-10-CM | POA: Diagnosis not present

## 2023-10-15 DIAGNOSIS — F4322 Adjustment disorder with anxiety: Secondary | ICD-10-CM | POA: Diagnosis not present

## 2023-10-16 ENCOUNTER — Other Ambulatory Visit (HOSPITAL_COMMUNITY): Payer: Self-pay

## 2023-10-16 DIAGNOSIS — D259 Leiomyoma of uterus, unspecified: Secondary | ICD-10-CM

## 2023-10-17 DIAGNOSIS — F4320 Adjustment disorder, unspecified: Secondary | ICD-10-CM | POA: Diagnosis not present

## 2023-10-19 DIAGNOSIS — F411 Generalized anxiety disorder: Secondary | ICD-10-CM | POA: Diagnosis not present

## 2023-10-19 DIAGNOSIS — F330 Major depressive disorder, recurrent, mild: Secondary | ICD-10-CM | POA: Diagnosis not present

## 2023-10-19 DIAGNOSIS — F902 Attention-deficit hyperactivity disorder, combined type: Secondary | ICD-10-CM | POA: Diagnosis not present

## 2023-10-22 ENCOUNTER — Ambulatory Visit (HOSPITAL_COMMUNITY)
Admission: RE | Admit: 2023-10-22 | Discharge: 2023-10-22 | Disposition: A | Discharge status: Home / Self Care | Source: Ambulatory Visit

## 2023-10-22 DIAGNOSIS — F4322 Adjustment disorder with anxiety: Secondary | ICD-10-CM | POA: Diagnosis not present

## 2023-10-22 DIAGNOSIS — D259 Leiomyoma of uterus, unspecified: Secondary | ICD-10-CM | POA: Diagnosis not present

## 2023-10-22 MED ORDER — GADOBUTROL 1 MMOL/ML IV SOLN
10.0000 mL | Freq: Once | INTRAVENOUS | Status: AC | PRN
  Administered 2023-10-22: 10 mL via INTRAVENOUS

## 2023-10-23 DIAGNOSIS — F330 Major depressive disorder, recurrent, mild: Secondary | ICD-10-CM | POA: Diagnosis not present

## 2023-10-23 DIAGNOSIS — R41840 Attention and concentration deficit: Secondary | ICD-10-CM | POA: Diagnosis not present

## 2023-10-29 DIAGNOSIS — F4322 Adjustment disorder with anxiety: Secondary | ICD-10-CM | POA: Diagnosis not present

## 2023-11-11 DIAGNOSIS — F4322 Adjustment disorder with anxiety: Secondary | ICD-10-CM | POA: Diagnosis not present

## 2023-11-13 ENCOUNTER — Encounter: Payer: Self-pay | Admitting: Obstetrics & Gynecology

## 2023-11-16 DIAGNOSIS — F411 Generalized anxiety disorder: Secondary | ICD-10-CM | POA: Diagnosis not present

## 2023-11-16 DIAGNOSIS — F332 Major depressive disorder, recurrent severe without psychotic features: Secondary | ICD-10-CM | POA: Diagnosis not present

## 2023-11-17 DIAGNOSIS — F4322 Adjustment disorder with anxiety: Secondary | ICD-10-CM | POA: Diagnosis not present

## 2023-11-26 DIAGNOSIS — F411 Generalized anxiety disorder: Secondary | ICD-10-CM | POA: Diagnosis not present

## 2023-11-26 DIAGNOSIS — F332 Major depressive disorder, recurrent severe without psychotic features: Secondary | ICD-10-CM | POA: Diagnosis not present

## 2023-12-03 DIAGNOSIS — F411 Generalized anxiety disorder: Secondary | ICD-10-CM | POA: Diagnosis not present

## 2023-12-03 DIAGNOSIS — F332 Major depressive disorder, recurrent severe without psychotic features: Secondary | ICD-10-CM | POA: Diagnosis not present

## 2023-12-14 DIAGNOSIS — F411 Generalized anxiety disorder: Secondary | ICD-10-CM | POA: Diagnosis not present

## 2023-12-15 DIAGNOSIS — F4322 Adjustment disorder with anxiety: Secondary | ICD-10-CM | POA: Diagnosis not present

## 2023-12-22 DIAGNOSIS — F332 Major depressive disorder, recurrent severe without psychotic features: Secondary | ICD-10-CM | POA: Diagnosis not present

## 2023-12-22 DIAGNOSIS — F411 Generalized anxiety disorder: Secondary | ICD-10-CM | POA: Diagnosis not present

## 2023-12-23 DIAGNOSIS — F902 Attention-deficit hyperactivity disorder, combined type: Secondary | ICD-10-CM | POA: Diagnosis not present

## 2023-12-23 DIAGNOSIS — F411 Generalized anxiety disorder: Secondary | ICD-10-CM | POA: Diagnosis not present

## 2023-12-23 DIAGNOSIS — F330 Major depressive disorder, recurrent, mild: Secondary | ICD-10-CM | POA: Diagnosis not present

## 2023-12-29 DIAGNOSIS — F332 Major depressive disorder, recurrent severe without psychotic features: Secondary | ICD-10-CM | POA: Diagnosis not present

## 2023-12-29 DIAGNOSIS — F411 Generalized anxiety disorder: Secondary | ICD-10-CM | POA: Diagnosis not present

## 2024-01-05 DIAGNOSIS — F332 Major depressive disorder, recurrent severe without psychotic features: Secondary | ICD-10-CM | POA: Diagnosis not present

## 2024-01-05 DIAGNOSIS — F411 Generalized anxiety disorder: Secondary | ICD-10-CM | POA: Diagnosis not present

## 2024-01-11 DIAGNOSIS — M542 Cervicalgia: Secondary | ICD-10-CM | POA: Diagnosis not present

## 2024-01-11 DIAGNOSIS — R5382 Chronic fatigue, unspecified: Secondary | ICD-10-CM | POA: Diagnosis not present

## 2024-01-11 DIAGNOSIS — M2550 Pain in unspecified joint: Secondary | ICD-10-CM | POA: Diagnosis not present

## 2024-01-11 DIAGNOSIS — R5381 Other malaise: Secondary | ICD-10-CM | POA: Diagnosis not present

## 2024-01-20 DIAGNOSIS — F411 Generalized anxiety disorder: Secondary | ICD-10-CM | POA: Diagnosis not present

## 2024-01-20 DIAGNOSIS — F332 Major depressive disorder, recurrent severe without psychotic features: Secondary | ICD-10-CM | POA: Diagnosis not present

## 2024-01-29 DIAGNOSIS — R5381 Other malaise: Secondary | ICD-10-CM | POA: Diagnosis not present

## 2024-01-29 DIAGNOSIS — G4710 Hypersomnia, unspecified: Secondary | ICD-10-CM | POA: Diagnosis not present

## 2024-01-29 DIAGNOSIS — F411 Generalized anxiety disorder: Secondary | ICD-10-CM | POA: Diagnosis not present

## 2024-01-29 DIAGNOSIS — R5382 Chronic fatigue, unspecified: Secondary | ICD-10-CM | POA: Diagnosis not present

## 2024-01-29 DIAGNOSIS — M357 Hypermobility syndrome: Secondary | ICD-10-CM | POA: Diagnosis not present

## 2024-01-29 DIAGNOSIS — G4719 Other hypersomnia: Secondary | ICD-10-CM | POA: Diagnosis not present

## 2024-01-29 DIAGNOSIS — Z8659 Personal history of other mental and behavioral disorders: Secondary | ICD-10-CM | POA: Diagnosis not present

## 2024-01-29 DIAGNOSIS — F332 Major depressive disorder, recurrent severe without psychotic features: Secondary | ICD-10-CM | POA: Diagnosis not present

## 2024-02-05 DIAGNOSIS — F411 Generalized anxiety disorder: Secondary | ICD-10-CM | POA: Diagnosis not present

## 2024-02-05 DIAGNOSIS — F332 Major depressive disorder, recurrent severe without psychotic features: Secondary | ICD-10-CM | POA: Diagnosis not present

## 2024-02-15 ENCOUNTER — Encounter: Payer: Self-pay | Admitting: Family Medicine

## 2024-02-15 ENCOUNTER — Ambulatory Visit (INDEPENDENT_AMBULATORY_CARE_PROVIDER_SITE_OTHER): Admitting: Family Medicine

## 2024-02-15 VITALS — BP 110/68 | Ht 68.0 in | Wt 232.0 lb

## 2024-02-15 DIAGNOSIS — Z23 Encounter for immunization: Secondary | ICD-10-CM

## 2024-02-15 DIAGNOSIS — Z91038 Other insect allergy status: Secondary | ICD-10-CM

## 2024-02-15 DIAGNOSIS — Q682 Congenital deformity of knee: Secondary | ICD-10-CM

## 2024-02-15 DIAGNOSIS — E282 Polycystic ovarian syndrome: Secondary | ICD-10-CM | POA: Diagnosis not present

## 2024-02-15 DIAGNOSIS — Z6835 Body mass index (BMI) 35.0-35.9, adult: Secondary | ICD-10-CM

## 2024-02-15 DIAGNOSIS — Z1231 Encounter for screening mammogram for malignant neoplasm of breast: Secondary | ICD-10-CM

## 2024-02-15 DIAGNOSIS — E559 Vitamin D deficiency, unspecified: Secondary | ICD-10-CM | POA: Diagnosis not present

## 2024-02-15 DIAGNOSIS — N920 Excessive and frequent menstruation with regular cycle: Secondary | ICD-10-CM

## 2024-02-15 DIAGNOSIS — D259 Leiomyoma of uterus, unspecified: Secondary | ICD-10-CM | POA: Diagnosis not present

## 2024-02-15 DIAGNOSIS — Z0000 Encounter for general adult medical examination without abnormal findings: Secondary | ICD-10-CM

## 2024-02-15 DIAGNOSIS — M222X2 Patellofemoral disorders, left knee: Secondary | ICD-10-CM

## 2024-02-15 MED ORDER — METFORMIN HCL ER 500 MG PO TB24
500.0000 mg | ORAL_TABLET | Freq: Every day | ORAL | 0 refills | Status: AC
Start: 2024-02-15 — End: ?

## 2024-02-15 MED ORDER — NAPROXEN SODIUM 550 MG PO TABS
550.0000 mg | ORAL_TABLET | Freq: Two times a day (BID) | ORAL | 2 refills | Status: AC
Start: 2024-02-15 — End: ?

## 2024-02-15 MED ORDER — EPINEPHRINE 0.3 MG/0.3ML IJ SOAJ: 0.3000 mg | Freq: Once | INTRAMUSCULAR | 0 refills | Status: AC | PRN

## 2024-02-17 DIAGNOSIS — Z91038 Other insect allergy status: Secondary | ICD-10-CM | POA: Insufficient documentation

## 2024-02-17 DIAGNOSIS — N920 Excessive and frequent menstruation with regular cycle: Secondary | ICD-10-CM | POA: Insufficient documentation

## 2024-02-17 DIAGNOSIS — Z6835 Body mass index (BMI) 35.0-35.9, adult: Secondary | ICD-10-CM | POA: Insufficient documentation

## 2024-02-17 DIAGNOSIS — E559 Vitamin D deficiency, unspecified: Secondary | ICD-10-CM | POA: Insufficient documentation

## 2024-02-17 NOTE — Progress Notes (Signed)
 Primary Care / Sports Medicine Office Visit  Patient Information:  Patient ID: Sarah Lowe, female DOB: [birth year] Age: 43 y.o. MRN: [id number]   Sarah Lowe is a pleasant 43 y.o. female presenting with the following:  Chief Complaint  Patient presents with   Annual Exam    Vitals:   02/15/24 0938  BP: 110/68  SpO2: 99%   Vitals:   02/15/24 0938  Weight: 232 lb (105.2 kg)  Height: 5' 8 (1.727 m)   Body mass index is 35.28 kg/m.  No results found.   Discussed the use of AI scribe software for clinical note transcription with the patient, who gave verbal consent to proceed.   Independent interpretation of notes and tests performed by another provider:   None  Procedures performed:   None  Pertinent History, Exam, Impression, and Recommendations:  History of Present Illness Sarah Lowe is a 43 year old female who presents for follow-up regarding her upcoming surgery and ongoing symptoms.  Fatigue - Significant fatigue persists - Chronic fatigue history - Considering low-dose naltrexone for symptom relief, but not currently taking this medication  Uterine fibroids and menstrual symptoms - Diagnosed with uterine fibroids - Surgery initially scheduled for September, postponed to January; planned to be performed robotically - Hopes surgery will alleviate symptoms - Uses naproxen  as needed for menstrual cramps  Polycystic ovary syndrome (pcos) management - Treated with metformin  and spironolactone - Metformin  managed by gynecologist - Experienced side effects with increased metformin  doses; currently maintained on low dose  Allergic symptoms and mast cell activation - History of allergies - Allergy flare over the summer with burning and numbness in upper lip - Informed that symptoms could be related to anxiety or mast cell activation syndrome (MCAS)  Nasal symptoms - Uses Qnasal nasal spray for nasal symptoms  Knee pain -  Requires gel injections for knee pain, but not approved due to out-of-state treatment - Considering alternative options for knee pain relief  Supplement use - Continues supplements including CoQ10, L-carnitine, B12, magnesium, vitamin D , and cortisol - Supplements managed by [employer]  Breast health surveillance - No recent mammogram - Needs to schedule mammogram due to previous finding  Physical Exam NECK: No cervical adenopathy. CHEST: Lungs clear. CARDIOVASCULAR: Heart sounds normal.  Assessment and Plan Uterine fibroids with excessive and frequent menstruation Scheduled for robotic surgery in January. - Continue naproxen  550 mg as needed for menstrual cramps. - Proceed with scheduled robotic surgery for fibroids in January.  Polycystic ovary syndrome with chronic pain and fatigue Chronic pain and fatigue possibly related to PCOS. Considering low dose naltrexone (LDN) for pain and chronic fatigue. Shared decision-making regarding starting LDN post-surgery. - Discuss LDN with psychiatrist for potential initiation post-surgery. - Continue metformin  as prescribed by GYN. - Continue spironolactone as managed by GYN.  Knee pain Previous PRP injections. Considering low dose radiation treatment (LDRT) as an alternative to gel injections. Insurance coverage for LDRT noted. - Referred to radiation oncologist for consultation on low dose radiation treatment (LDRT). - Research LDRT and consult with radiation oncologist.  Obesity Discussed potential benefits of low dose naltrexone (LDN) for weight loss by reducing cravings and inflammation. - Discuss LDN with psychiatrist for potential initiation post-surgery. - Consider dietary modifications and stress management strategies.  Other insect allergy status Allergies flared up during summer. - Refilled EpiPen  and ensured expiration date is appropriate. - Continue Qnasal as needed for nasal symptoms.  Vitamin D   deficiency Vitamin D  levels  to be checked as part of routine lab work. - Ordered vitamin D  level as part of routine lab work.  Screening for malignant neoplasm of breast Due for mammogram screening. - Ordered mammogram screening. 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       Relevant Medications   EPINEPHrine  0.3 mg/0.3 mL IJ SOAJ injection     Menorrhagia with regular cycle       Relevant Medications   naproxen  sodium (ANAPROX  DS) 550 MG tablet     Vitamin D  deficiency       Relevant Orders   VITAMIN D  25 Hydroxy (Vit-D Deficiency, Fractures)     Encounter for immunization       Relevant Orders   Flu vaccine trivalent PF, 6mos and older(Flulaval,Afluria,Fluarix,Fluzone) (Completed)        Orders & Medications Medications:  Meds ordered this encounter  Medications   EPINEPHrine  0.3 mg/0.3 mL IJ SOAJ injection    Sig: Inject 0.3 mg into the muscle once as needed.    Dispense:  1 each    Refill:  0   naproxen  sodium (ANAPROX  DS) 550 MG tablet    Sig: Take 1 tablet (550 mg total) by mouth 2 (two) times daily with a meal. Take as instructed for your period or as needed.    Dispense:  30 tablet    Refill:  2   metFORMIN  (GLUCOPHAGE -XR) 500 MG 24 hr tablet    Sig: Take 1 tablet (500 mg total) by mouth daily with breakfast.    Dispense:  90 tablet    Refill:  0   Orders Placed This Encounter  Procedures   Flu vaccine trivalent PF, 6mos and older(Flulaval,Afluria,Fluarix,Fluzone)   CBC   Comprehensive metabolic panel with GFR   Hemoglobin A1c   Lipid panel   VITAMIN D  25 Hydroxy (Vit-D Deficiency, Fractures)     No follow-ups on file.     Selinda JINNY Ku, MD, [facility]   Primary Care Sports Medicine Primary Care and Sports Medicine at [facility]

## 2024-02-17 NOTE — Patient Instructions (Signed)
-   Obtain fasting labs with orders provided (can have water or black coffee but otherwise no food or drink x 8 hours before labs) - Review information provided - Attend eye doctor annually, dentist every 6 months, work towards or maintain 30 minutes of moderate intensity physical activity at least 5 days per week, and consume a balanced diet - Return in 1 year for physical - Contact us  for any questions between now and then   VISIT SUMMARY:  Today, we discussed your ongoing symptoms and upcoming surgery. We reviewed your fatigue, uterine fibroids, PCOS management, knee pain, allergies, and the need for a mammogram.  YOUR PLAN:  UTERINE FIBROIDS WITH EXCESSIVE AND FREQUENT MENSTRUATION: You have uterine fibroids causing heavy and frequent periods. -Continue taking naproxen  550 mg as needed for menstrual cramps. -Proceed with the scheduled robotic surgery for fibroids in January.  POLYCYSTIC OVARY SYNDROME (PCOS) WITH CHRONIC PAIN AND FATIGUE: Your chronic pain and fatigue may be related to PCOS. -Discuss low dose naltrexone (LDN) with your psychiatrist for potential initiation after your surgery. -Continue taking metformin  and spironolactone as prescribed by your gynecologist.  KNEE PAIN: You have ongoing knee pain and are considering alternative treatments. -You are referred to a radiation oncologist for a consultation on low dose radiation treatment (LDRT). -Research LDRT and consult with the radiation oncologist.  OBESITY: We discussed the potential benefits of low dose naltrexone (LDN) for weight loss. -Discuss LDN with your psychiatrist for potential initiation after your surgery. -Consider dietary modifications and stress management strategies.  ALLERGIES: You experienced allergy flare-ups over the summer. -Your EpiPen  has been refilled and the expiration date checked. -Continue using Qnasal as needed for nasal symptoms.  VITAMIN D  DEFICIENCY: We need to check your vitamin D   levels. -Vitamin D  levels will be checked as part of your routine lab work.  BREAST HEALTH SURVEILLANCE: You are due for a mammogram screening. -A mammogram screening has been ordered.

## 2024-02-22 DIAGNOSIS — F330 Major depressive disorder, recurrent, mild: Secondary | ICD-10-CM | POA: Diagnosis not present

## 2024-02-22 DIAGNOSIS — F411 Generalized anxiety disorder: Secondary | ICD-10-CM | POA: Diagnosis not present

## 2024-02-22 DIAGNOSIS — F902 Attention-deficit hyperactivity disorder, combined type: Secondary | ICD-10-CM | POA: Diagnosis not present

## 2024-02-24 DIAGNOSIS — E559 Vitamin D deficiency, unspecified: Secondary | ICD-10-CM | POA: Diagnosis not present

## 2024-02-24 DIAGNOSIS — Z0000 Encounter for general adult medical examination without abnormal findings: Secondary | ICD-10-CM | POA: Diagnosis not present

## 2024-02-25 DIAGNOSIS — F4322 Adjustment disorder with anxiety: Secondary | ICD-10-CM | POA: Diagnosis not present

## 2024-02-25 LAB — CBC
Hematocrit: 33.9 % — ABNORMAL LOW (ref 34.0–46.6)
Hemoglobin: 10.7 g/dL — ABNORMAL LOW (ref 11.1–15.9)
MCH: 27.4 pg (ref 26.6–33.0)
MCHC: 31.6 g/dL (ref 31.5–35.7)
MCV: 87 fL (ref 79–97)
Platelets: 277 x10E3/uL (ref 150–450)
RBC: 3.91 x10E6/uL (ref 3.77–5.28)
RDW: 14.3 % (ref 11.7–15.4)
WBC: 3.2 x10E3/uL — ABNORMAL LOW (ref 3.4–10.8)

## 2024-02-25 LAB — COMPREHENSIVE METABOLIC PANEL WITH GFR
ALT: 9 IU/L (ref 0–32)
AST: 13 IU/L (ref 0–40)
Albumin: 3.9 g/dL (ref 3.9–4.9)
Alkaline Phosphatase: 60 IU/L (ref 41–116)
BUN/Creatinine Ratio: 13 (ref 9–23)
BUN: 12 mg/dL (ref 6–24)
Bilirubin Total: 0.2 mg/dL (ref 0.0–1.2)
CO2: 23 mmol/L (ref 20–29)
Calcium: 9.3 mg/dL (ref 8.7–10.2)
Chloride: 102 mmol/L (ref 96–106)
Creatinine, Ser: 0.9 mg/dL (ref 0.57–1.00)
Globulin, Total: 2.7 g/dL (ref 1.5–4.5)
Glucose: 82 mg/dL (ref 70–99)
Potassium: 4.2 mmol/L (ref 3.5–5.2)
Sodium: 136 mmol/L (ref 134–144)
Total Protein: 6.6 g/dL (ref 6.0–8.5)
eGFR: 81 mL/min/1.73 (ref >59)

## 2024-02-25 LAB — HEMOGLOBIN A1C
Est. average glucose Bld gHb Est-mCnc: 117 mg/dL
Hgb A1c MFr Bld: 5.7 % — ABNORMAL HIGH (ref 4.8–5.6)

## 2024-02-25 LAB — LIPID PANEL
Chol/HDL Ratio: 4.4 ratio (ref 0.0–4.4)
Cholesterol, Total: 241 mg/dL — ABNORMAL HIGH (ref 100–199)
HDL: 55 mg/dL (ref >39)
LDL Chol Calc (NIH): 175 mg/dL — ABNORMAL HIGH (ref 0–99)
Triglycerides: 65 mg/dL (ref 0–149)
VLDL Cholesterol Cal: 11 mg/dL (ref 5–40)

## 2024-02-25 LAB — VITAMIN D 25 HYDROXY (VIT D DEFICIENCY, FRACTURES): Vit D, 25-Hydroxy: 41.1 ng/mL (ref 30.0–100.0)

## 2024-03-02 DIAGNOSIS — F4322 Adjustment disorder with anxiety: Secondary | ICD-10-CM | POA: Diagnosis not present

## 2024-03-04 DIAGNOSIS — F411 Generalized anxiety disorder: Secondary | ICD-10-CM | POA: Diagnosis not present

## 2024-03-04 DIAGNOSIS — F332 Major depressive disorder, recurrent severe without psychotic features: Secondary | ICD-10-CM | POA: Diagnosis not present

## 2024-03-09 ENCOUNTER — Telehealth: Payer: Self-pay | Admitting: Family Medicine

## 2024-03-09 DIAGNOSIS — F332 Major depressive disorder, recurrent severe without psychotic features: Secondary | ICD-10-CM | POA: Diagnosis not present

## 2024-03-09 DIAGNOSIS — F411 Generalized anxiety disorder: Secondary | ICD-10-CM | POA: Diagnosis not present

## 2024-03-09 NOTE — Telephone Encounter (Signed)
 Copied from CRM #[id number]. Topic: General - Other >> Mar 09, 2024  2:14 PM Eva FALCON wrote: Reason for CRM: Osato from [facility] is calling in and states she is needing a CPT code for a certain gel injection this patient needs. If able please reach patient [phone] with that CPT code and that way she can reach back out to [facility] and provide that code to see if the injection will be covered.

## 2024-03-09 NOTE — Telephone Encounter (Signed)
 Please review and advise.  JM

## 2024-03-11 DIAGNOSIS — R251 Tremor, unspecified: Secondary | ICD-10-CM | POA: Diagnosis not present

## 2024-03-11 DIAGNOSIS — H43391 Other vitreous opacities, right eye: Secondary | ICD-10-CM | POA: Diagnosis not present

## 2024-03-15 DIAGNOSIS — Z01818 Encounter for other preprocedural examination: Secondary | ICD-10-CM | POA: Diagnosis not present

## 2024-03-21 DIAGNOSIS — F332 Major depressive disorder, recurrent severe without psychotic features: Secondary | ICD-10-CM | POA: Diagnosis not present

## 2024-03-21 DIAGNOSIS — F411 Generalized anxiety disorder: Secondary | ICD-10-CM | POA: Diagnosis not present

## 2024-03-25 ENCOUNTER — Ambulatory Visit: Payer: Self-pay | Admitting: Family Medicine

## 2024-03-28 DIAGNOSIS — Z806 Family history of leukemia: Secondary | ICD-10-CM

## 2024-03-28 DIAGNOSIS — D649 Anemia, unspecified: Secondary | ICD-10-CM

## 2024-03-28 DIAGNOSIS — D72818 Other decreased white blood cell count: Secondary | ICD-10-CM

## 2024-03-28 NOTE — Telephone Encounter (Signed)
 Please review and advise.  JM

## 2024-03-29 NOTE — Telephone Encounter (Signed)
 Spoke with patient and gave hr CPT codes for Euflexxa and Orthovisc. She will relay this to her insurance company.  JM

## 2024-04-06 ENCOUNTER — Inpatient Hospital Stay

## 2024-04-06 ENCOUNTER — Encounter: Payer: Self-pay | Admitting: Internal Medicine

## 2024-04-06 ENCOUNTER — Inpatient Hospital Stay: Attending: Internal Medicine | Admitting: Internal Medicine

## 2024-04-06 VITALS — BP 125/73 | HR 76 | Temp 98.4°F | Resp 12 | Ht 68.0 in | Wt 232.6 lb

## 2024-04-06 DIAGNOSIS — D649 Anemia, unspecified: Secondary | ICD-10-CM | POA: Diagnosis not present

## 2024-04-06 DIAGNOSIS — D709 Neutropenia, unspecified: Secondary | ICD-10-CM

## 2024-04-06 LAB — IRON AND TIBC
Iron: 118 ug/dL (ref 28–170)
Saturation Ratios: 28 % (ref 10.4–31.8)
TIBC: 416 ug/dL (ref 250–450)
UIBC: 298 ug/dL

## 2024-04-06 LAB — HIV ANTIBODY (ROUTINE TESTING W REFLEX): HIV Screen 4th Generation wRfx: NONREACTIVE

## 2024-04-06 LAB — COMPREHENSIVE METABOLIC PANEL WITH GFR
ALT: 13 U/L (ref 0–44)
AST: 18 U/L (ref 15–41)
Albumin: 4.4 g/dL (ref 3.5–5.0)
Alkaline Phosphatase: 63 U/L (ref 38–126)
Anion gap: 10 (ref 5–15)
BUN: 10 mg/dL (ref 6–20)
CO2: 26 mmol/L (ref 22–32)
Calcium: 9.4 mg/dL (ref 8.9–10.3)
Chloride: 103 mmol/L (ref 98–111)
Creatinine, Ser: 0.94 mg/dL (ref 0.44–1.00)
GFR, Estimated: >60 mL/min
Glucose, Bld: 88 mg/dL (ref 70–99)
Potassium: 4.1 mmol/L (ref 3.5–5.1)
Sodium: 138 mmol/L (ref 135–145)
Total Bilirubin: 0.3 mg/dL (ref 0.0–1.2)
Total Protein: 7.6 g/dL (ref 6.5–8.1)

## 2024-04-06 LAB — CBC WITH DIFFERENTIAL/PLATELET
Abs Immature Granulocytes: 0 K/uL (ref 0.00–0.07)
Basophils Absolute: 0 K/uL (ref 0.0–0.1)
Basophils Relative: 1 %
Eosinophils Absolute: 0 K/uL (ref 0.0–0.5)
Eosinophils Relative: 1 %
HCT: 35.3 % — ABNORMAL LOW (ref 36.0–46.0)
Hemoglobin: 11 g/dL — ABNORMAL LOW (ref 12.0–15.0)
Immature Granulocytes: 0 %
Lymphocytes Relative: 38 %
Lymphs Abs: 0.9 K/uL (ref 0.7–4.0)
MCH: 26.9 pg (ref 26.0–34.0)
MCHC: 31.2 g/dL (ref 30.0–36.0)
MCV: 86.3 fL (ref 80.0–100.0)
Monocytes Absolute: 0.2 K/uL (ref 0.1–1.0)
Monocytes Relative: 10 %
Neutro Abs: 1.2 K/uL — ABNORMAL LOW (ref 1.7–7.7)
Neutrophils Relative %: 50 %
Platelets: 314 K/uL (ref 150–400)
RBC: 4.09 MIL/uL (ref 3.87–5.11)
RDW: 14.3 % (ref 11.5–15.5)
WBC: 2.4 K/uL — ABNORMAL LOW (ref 4.0–10.5)
nRBC: 0 % (ref 0.0–0.2)

## 2024-04-06 LAB — LACTATE DEHYDROGENASE: LDH: 211 U/L (ref 105–235)

## 2024-04-06 LAB — RETICULOCYTES
Immature Retic Fract: 12.9 % (ref 2.3–15.9)
RBC.: 4.1 MIL/uL (ref 3.87–5.11)
Retic Count, Absolute: 45.1 K/uL (ref 19.0–186.0)
Retic Ct Pct: 1.1 % (ref 0.4–3.1)

## 2024-04-06 LAB — FERRITIN: Ferritin: 14 ng/mL (ref 11–307)

## 2024-04-06 LAB — HEPATITIS B SURFACE ANTIGEN: Hepatitis B Surface Ag: NONREACTIVE

## 2024-04-06 LAB — HEPATITIS C ANTIBODY: HCV Ab: NONREACTIVE

## 2024-04-06 LAB — VITAMIN B12: Vitamin B-12: 1912 pg/mL — ABNORMAL HIGH (ref 180–914)

## 2024-04-06 NOTE — Assessment & Plan Note (Addendum)
#   Leukopenia-mild neutropenia/lymphopenia [December 2025] 1.2; mild anemia hemoglobin 10/platelets. Patient is asymptomatic-no increased risk of infections. Suspect benign causes rather than any malignant causes.  Clinically suggestive of benign ethnic neutropenia.  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 No evidence of hepatosplenomegaly or liver disease.  Also discussed the possibility of autoimmune disorder-however less likely given absence of patient's obvious diagnosis.   # Recommend checking CBC CMP LDH;B12; folic acid ; HIV, Hep C; Hep B;copper . I discussed the possibility/need for a bone marrow biopsy if significant neutropenia is continued. However we will plan to hold bone marrow biopsy for now.   # Mild anemia/history of heavy menstrual cycles-awaiting fibroid surgery with gynecology [facility]-next week; recommend checking iron studies ferritin and also gentle iron recommended.  # ADHD- [facility]- June 2025]- on adderral XL.   Thank you Dr.Matthews, MD-  for allowing me to participate in the care of your pleasant patient. Please do not hesitate to contact me with questions or concerns in the interim.  # DISPOSITION: # labs- ordered- CBC CMP LDH;B12; folic acid ; HIV, Hep C; Hep B; copper -;iron studies; ferritin.  # follow up in 4 months- cbc; bmp; iron studies;ferritin-- -Dr.B

## 2024-04-06 NOTE — Progress Notes (Signed)
 No questions/concerns at this time. She is having surgery to have fibroids removed next week, [facility]ann[facility].

## 2024-04-06 NOTE — Progress Notes (Signed)
 [facility] CONSULT NOTE  Patient Care Team: Alvia Selinda PARAS, MD as PCP - General (Family Medicine) Darliss Rogue, MD as PCP - Cardiology (Cardiology) Rennie Cindy SAUNDERS, MD as Consulting Physician (Oncology)  # CHIEF COMPLAINTS/PURPOSE OF CONSULTATION: Leucopenia  # LEUCOPENIA/NEUTROPENIA- ANC; Hb; platelets; CT Ab/US ; Hepatitis/HIV; Alcohol  #  Oncology History   No problem history exists.     No questions/concerns at this time. She is having surgery to have fibroids removed next week, [facility]   HISTORY OF PRESENTING ILLNESS:   Sarah Lowe 44 y.o.  female with a prior history is here for further evaluation leucopenia.   Discussed the use of AI scribe software for clinical note transcription with the patient, who gave verbal consent to proceed.  History of Present Illness   Sarah Lowe is a 44 year old female with persistent neutropenia and anemia who presents for hematology evaluation.  Over the past 2-3 years, she has had intermittent mild neutropenia, with white blood cell counts ranging from 2.6 to 3.6 and concurrent lymphopenia. Anemia developed over the past year. She notes transient improvement in white blood cell counts with steroid use. She has not experienced frequent or severe infections, has not required hospitalization for intravenous antibiotics, and has not had new or progressive lymphadenopathy. Occasional sinus infections have responded to nasal inhaler therapy. She denies weight loss, skin rashes, or other constitutional symptoms.  She is scheduled for surgical removal of uterine fibroids next week. The fibroids have caused menorrhagia and increased menstrual frequency, resulting in significant fatigue and hair loss. She has not previously taken iron supplementation. She has experienced loss of three teeth in the past 1-2 years, which was considered unrelated to her hematologic abnormalities.  She has polycystic  ovary syndrome and is currently taking spironolactone and metformin . She also takes vitamin D  and intermittently uses Adderall, which was initiated in mid-2025 for work-related cognitive difficulties. The cytopenias predated Adderall initiation, and she has not observed any correlation between medication use and her blood counts.  She has a remote history of Lyme disease in 2018 or 2019, treated with doxycycline, with prior symptoms of arthralgia and hair loss. She continues to experience joint pain, which she attributes to horseback riding injuries. Autoimmune serologies have shown a speckled ANA pattern and mildly elevated inflammatory markers, but these findings were not considered significant by her rheumatologist. She has not been diagnosed with an autoimmune disease. She denies history of gastric bypass or significant alcohol use.      Patient noted to have low white count incidentally on blood work.  Patient denies any unusual symptoms of the time of the blood test.   Frequent infections [pneumonias/sinus infection/UTI]- none  Early satiety: None Lymph node enlargement: None  Weight loss: None  Skin rash: none Autoimmune: none definitive.  Gastric bypass: copper  deficiency Medications: NSAIDs/antiepileptics-none. Alcohol: rare Family History/Ancestry: none; African American; T cell LGL- Leukopenia- mother- with RA.   Review of Systems  Constitutional:  Negative for chills, diaphoresis, fever, malaise/fatigue and weight loss.  HENT:  Negative for nosebleeds and sore throat.   Eyes:  Negative for double vision.  Respiratory:  Negative for cough, hemoptysis, sputum production, shortness of breath and wheezing.   Cardiovascular:  Negative for chest pain, palpitations, orthopnea and leg swelling.  Gastrointestinal:  Negative for abdominal pain, blood in stool, constipation, diarrhea, heartburn, melena, nausea and vomiting.  Genitourinary:  Negative for dysuria, frequency and urgency.   Musculoskeletal:  Negative for back pain and joint  pain.  Skin: Negative.  Negative for itching and rash.  Neurological:  Negative for dizziness, tingling, focal weakness, weakness and headaches.  Endo/Heme/Allergies:  Does not bruise/bleed easily.  Psychiatric/Behavioral:  Negative for depression. The patient is not nervous/anxious and does not have insomnia.      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   PONV (postoperative nausea and vomiting)     SURGICAL HISTORY: Past Surgical History:  Procedure Laterality Date   FINGER MASS EXCISION Right 2021   fourth finger   GUM SURGERY  2015   NASAL SEPTOPLASTY W/ TURBINOPLASTY Bilateral 08/15/2021   Procedure: NASAL SEPTOPLASTY WITH INFERIOR TURBINATE REDUCTION;  Surgeon: Edda Mt, MD;  Location[facility];  Service: ENT;  Laterality: Bilateral;   NASAL TURBINATE REDUCTION Right 08/15/2021   Procedure: MIDDLE TURBINATE REDUCTION;  Surgeon: Edda Mt, MD;  Location: [facility];  Service: ENT;  Laterality: Right;   PATELLAR TENDON REPAIR Bilateral 2017   WISDOM TOOTH EXTRACTION Bilateral 2001    SOCIAL HISTORY: Social History   Socioeconomic History   Marital status: Married    Spouse name: Lamar Rocks   Number of children: 0   Years of education: 16   Highest education level: Bachelor's degree (e.g., BA, AB, BS)  Occupational History   Not on file  Tobacco Use   Smoking status: Never   Smokeless tobacco: Never  Vaping Use   Vaping status: Never Used  Substance and Sexual Activity   Alcohol use: Yes    Alcohol/week: 3.0 standard drinks of alcohol    Types: 3 Standard drinks or equivalent per week   Drug use: Never   Sexual activity: Yes    Partners: Male    Birth control/protection: None  Other Topics Concern   Not on file  Social  History Narrative   Not on file   Social Drivers of Health   Tobacco Use: Low Risk (04/06/2024)   Patient History    Smoking Tobacco Use: Never    Smokeless Tobacco Use: Never    Passive Exposure: Not on file  Financial Resource Strain: Low Risk (08/06/2023)   Received from [facility]   Overall Financial Resource Strain (CARDIA)    Difficulty of Paying Living Expenses: Not hard at all  Food Insecurity: No Food Insecurity (04/06/2024)   Epic    Worried About Programme Researcher, Broadcasting/film/video in the Last Year: Never true    Ran Out of Food in the Last Year: Never true  Transportation Needs: No Transportation Needs (04/06/2024)   Epic    Lack of Transportation (Medical): No    Lack of Transportation (Non-Medical): No  Physical Activity: Sufficiently Active (08/06/2023)   Received from [facility]   Exercise Vital Sign    On average, how many days per week do you engage in moderate to strenuous exercise (like a brisk walk)?: 5 days    On average, how many minutes do you engage in exercise at this level?: 150+ min  Recent Concern: Physical Activity - Insufficiently Active (05/29/2023)   Received from [facility]   Exercise Vital Sign    On average, how many days per week do you engage in moderate to strenuous exercise (like a brisk walk)?: 3 days    On average, how many minutes do you engage in  exercise at this level?: 40 min  Stress: Stress Concern Present (08/06/2023)   Received from [facility] Occup[facility] - Occupational Stress Questionnaire    Feeling of Stress : To some extent  Social Connections: Socially Integrated (08/06/2023)   Received from [facility]   Social Network    How would you rate your social network (family, work, friends)?: Good participation with social networks  Intimate Partner Violence: Not At Risk (04/06/2024)   Epic    Fear of Current or Ex-Partner: No    Emotionally Abused: No    Physically Abused: No    Sexually Abused: No   Depression (PHQ2-9): Low Risk (04/06/2024)   Depression (PHQ2-9)    PHQ-2 Score: 0  Alcohol Screen: Low Risk (05/27/2021)   Alcohol Screen    Last Alcohol Screening Score (AUDIT): 2  Housing: Low Risk (04/06/2024)   Epic    Unable to Pay for Housing in the Last Year: No    Number of Times Moved in the Last Year: 0    Homeless in the Last Year: No  Utilities: Not At Risk (04/06/2024)   Epic    Threatened with loss of utilities: No  Health Literacy: Not on file    FAMILY HISTORY: Family History  Problem Relation Age of Onset   Other Mother        Leukopenia   Cancer Mother        TCell Large granular, lymphocytic leukemia   Anemia Mother    Ehlers-Danlos syndrome Mother    Osteoporosis Mother 69   Hypertension Father    Other Father        Agent Orange   Colon cancer Maternal Grandmother    Anuerysm Maternal Grandfather    Heart attack Paternal Grandmother    Stroke Paternal Grandmother    Anuerysm Paternal Grandfather     ALLERGIES:  has no known allergies.  MEDICATIONS:  Current Outpatient Medications  Medication Sig Dispense Refill   amphetamine-dextroamphetamine (ADDERALL XR) 5 MG 24 hr capsule Take 10 mg by mouth daily.     Beclomethasone Dipropionate  (QNASL ) 80 MCG/ACT AERS Place 2 sprays into both nostrils daily. 10.6 g 3   cetirizine  (ZYRTEC ) 10 MG tablet Take 1 tablet (10 mg total) by mouth 2 (two) times daily. Can increase to 20 mg bid 40 tablet 0   EPINEPHrine  0.3 mg/0.3 mL IJ SOAJ injection Inject 0.3 mg into the muscle once as needed. 1 each 0   metFORMIN  (GLUCOPHAGE -XR) 500 MG 24 hr tablet Take 1 tablet (500 mg total) by mouth daily with breakfast. (Patient taking differently: Take 1,000 mg by mouth daily with breakfast.) 90 tablet 0   naproxen  sodium (ANAPROX  DS) 550 MG tablet Take 1 tablet (550 mg total) by mouth 2 (two) times daily with a meal. Take as instructed for your period or as needed. (Patient taking differently: Take 550 mg by mouth 2 (two) times  daily as needed. Take as instructed for your period or as needed.) 30 tablet 2   spironolactone (ALDACTONE) 50 MG tablet Take 50 mg by mouth daily.     Vitamin D , Ergocalciferol , (DRISDOL ) 1.25 MG (50000 UNIT) CAPS capsule Take 50,000 Units by mouth every 7 (seven) days.     No current facility-administered medications for this visit.      PHYSICAL EXAMINATION:  Vitals:   04/06/24 1108  BP: 125/73  Pulse: 76  Resp: 12  Temp: 98.4 F (36.9 C)  SpO2: 100%   Filed  Weights   04/06/24 1108  Weight: 232 lb 9.6 oz (105.5 kg)    Physical Exam Vitals and nursing note reviewed.  HENT:     Head: Normocephalic and atraumatic.     Mouth/Throat:     Pharynx: Oropharynx is clear.  Eyes:     Extraocular Movements: Extraocular movements intact.     Pupils: Pupils are equal, round, and reactive to light.  Cardiovascular:     Rate and Rhythm: Normal rate and regular rhythm.  Pulmonary:     Comments: Decreased breath sounds bilaterally.  Abdominal:     Palpations: Abdomen is soft.  Musculoskeletal:        General: Normal range of motion.     Cervical back: Normal range of motion.  Skin:    General: Skin is warm.  Neurological:     General: No focal deficit present.     Mental Status: She is alert and oriented to person, place, and time.  Psychiatric:        Behavior: Behavior normal.        Judgment: Judgment normal.      LABORATORY DATA:  I have reviewed the data as listed Lab Results  Component Value Date   WBC 2.4 (L) 04/06/2024   HGB 11.0 (L) 04/06/2024   HCT 35.3 (L) 04/06/2024   MCV 86.3 04/06/2024   PLT 314 04/06/2024   Recent Labs    02/24/24 0916 04/06/24 1153  NA 136 138  K 4.2 4.1  CL 102 103  CO2 23 26  GLUCOSE 82 88  BUN 12 10  CREATININE 0.90 0.94  CALCIUM 9.3 9.4  GFRNONAA  --  >60  PROT 6.6 7.6  ALBUMIN 3.9 4.4  AST 13 18  ALT 9 13  ALKPHOS 60 63  BILITOT 0.2 0.3    RADIOGRAPHIC STUDIES: I have personally reviewed the radiological  images as listed and agreed with the findings in the report. No results found.  ASSESSMENT & PLAN:   Neutropenia, unspecified # Leukopenia-mild neutropenia/lymphopenia [December 2025] 1.2; mild anemia hemoglobin 10/platelets. Patient is asymptomatic-no increased risk of infections. Suspect benign causes rather than any malignant causes.  Clinically suggestive of benign ethnic neutropenia.  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 No evidence of hepatosplenomegaly or liver disease.  Also discussed the possibility of autoimmune disorder-however less likely given absence of patient's obvious diagnosis.   # Recommend checking CBC CMP LDH;B12; folic acid ; HIV, Hep C; Hep B;copper . I discussed the possibility/need for a bone marrow biopsy if significant neutropenia is continued. However we will plan to hold bone marrow biopsy for now.   # Mild anemia/history of heavy menstrual cycles-awaiting fibroid surgery with gynecology [facility]-next week; recommend checking iron studies ferritin and also gentle iron recommended.  # ADHD- [facility]- June 2025]- on adderral XL.   Thank you Dr.Matthews, MD-  for allowing me to participate in the care of your pleasant patient. Please do not hesitate to contact me with questions or concerns in the interim.  # DISPOSITION: # labs- ordered- CBC CMP LDH;B12; folic acid ; HIV, Hep C; Hep B; copper -;iron studies; ferritin.  # follow up in 4 months- cbc; bmp; iron studies;ferritin-- -Dr.B  All questions were answered. The patient knows to call the clinic with any problems, questions or concerns.    Cindy JONELLE Joe, MD 04/06/2024 12:37 PM

## 2024-04-08 LAB — COPPER, SERUM: Copper: 130 ug/dL (ref 76–142)

## 2024-04-10 ENCOUNTER — Other Ambulatory Visit: Payer: Self-pay | Admitting: Family Medicine

## 2024-04-10 DIAGNOSIS — J309 Allergic rhinitis, unspecified: Secondary | ICD-10-CM

## 2024-04-11 NOTE — Telephone Encounter (Signed)
 Requested medications are due for refill today.  yes  Requested medications are on the active medications list.  yes  Last refill. 08/24/2023 10.6 3 rf  Future visit scheduled.   yes  Notes to clinic.  Medication not assigned to a protocol. Please review for refill.     Requested Prescriptions  Pending Prescriptions Disp Refills   QNASL  80 MCG/ACT AERS [Pharmacy Med Name: QNASL  80 mcg/actuation nasal aerosol spray] 10.6 g 3    Sig: PLACE TWO SPRAYS IN EACH NOSTRIL ONCE DAILY     Off-Protocol Failed - 04/11/2024  3:36 PM      Failed - Medication not assigned to a protocol, review manually.      Passed - Valid encounter within last 12 months    Recent Outpatient Visits           1 month ago Healthcare maintenance   [facility] [facility], Selinda PARAS, MD   7 months ago Uterine leiomyoma, unspecified location   [facility], Selinda PARAS, MD   8 months ago Patellofemoral syndrome, left   [facility] at [facility], Selinda PARAS, MD   10 months ago Episode of recurrent major depressive disorder, unspecified depression episode severity   [place] [facility] at [facility], Selinda PARAS, MD   10 months ago Other secondary osteoarthritis of both knees   [facility] at [facility], Selinda PARAS, MD

## 2024-04-15 ENCOUNTER — Telehealth: Payer: Self-pay | Admitting: Family Medicine

## 2024-04-15 NOTE — Telephone Encounter (Signed)
 Copied from CRM #[id number]. Topic: Medical Record Request - Records Request >> Apr 15, 2024  9:31 AM Montie POUR wrote: Reason for CRM:  [facility] is requesting medical records so Ms. Yeater can get life insurance. Adrian's call back number is [phone]. I did give him medical records number and fax number.

## 2024-08-05 ENCOUNTER — Inpatient Hospital Stay: Admitting: Internal Medicine

## 2024-08-05 ENCOUNTER — Inpatient Hospital Stay

## 2025-02-15 ENCOUNTER — Encounter: Admitting: Family Medicine
